# Patient Record
Sex: Female | Born: 1980
Health system: Southern US, Community
[De-identification: ages and names within clinical notes are randomized; demographics above are authoritative.]

## PROBLEM LIST (undated history)

## (undated) DIAGNOSIS — Z789 Other specified health status: Secondary | ICD-10-CM

---

## 2000-05-23 ENCOUNTER — Other Ambulatory Visit: Admission: RE | Admit: 2000-05-23 | Discharge: 2000-05-23 | Payer: Self-pay | Admitting: Family Medicine

## 2004-10-05 ENCOUNTER — Other Ambulatory Visit: Admission: RE | Admit: 2004-10-05 | Discharge: 2004-10-05 | Payer: Self-pay | Admitting: Obstetrics and Gynecology

## 2008-04-07 ENCOUNTER — Other Ambulatory Visit: Admission: RE | Admit: 2008-04-07 | Discharge: 2008-04-07 | Payer: Self-pay | Admitting: Emergency Medicine

## 2010-09-02 HISTORY — PX: DILATION AND CURETTAGE OF UTERUS: SHX78

## 2011-03-27 LAB — OB RESULTS CONSOLE ABO/RH: RH Type: POSITIVE

## 2011-03-27 LAB — OB RESULTS CONSOLE ANTIBODY SCREEN: Antibody Screen: NEGATIVE

## 2011-09-24 LAB — OB RESULTS CONSOLE GBS
GBS: NEGATIVE
GBS: POSITIVE
GBS: POSITIVE

## 2011-10-15 ENCOUNTER — Encounter (HOSPITAL_COMMUNITY): Payer: Self-pay | Admitting: *Deleted

## 2011-10-15 ENCOUNTER — Inpatient Hospital Stay (HOSPITAL_COMMUNITY)
Admission: AD | Admit: 2011-10-15 | Discharge: 2011-10-17 | DRG: 775 | Disposition: A | Discharge status: Home / Self Care | Payer: Managed Care, Other (non HMO) | Source: Ambulatory Visit | Attending: Obstetrics and Gynecology | Admitting: Obstetrics and Gynecology

## 2011-10-15 ENCOUNTER — Encounter (HOSPITAL_COMMUNITY): Payer: Self-pay | Admitting: Anesthesiology

## 2011-10-15 ENCOUNTER — Inpatient Hospital Stay (HOSPITAL_COMMUNITY): Payer: Managed Care, Other (non HMO) | Admitting: Anesthesiology

## 2011-10-15 DIAGNOSIS — O4290 Premature rupture of membranes, unspecified as to length of time between rupture and onset of labor, unspecified weeks of gestation: Principal | ICD-10-CM | POA: Diagnosis present

## 2011-10-15 DIAGNOSIS — O99892 Other specified diseases and conditions complicating childbirth: Secondary | ICD-10-CM | POA: Diagnosis present

## 2011-10-15 DIAGNOSIS — Z22330 Carrier of Group B streptococcus: Secondary | ICD-10-CM

## 2011-10-15 HISTORY — DX: Other specified health status: Z78.9

## 2011-10-15 LAB — CBC
Platelets: 227 10*3/uL (ref 150–400)
RBC: 4.17 MIL/uL (ref 3.87–5.11)
RDW: 13.3 % (ref 11.5–15.5)
WBC: 10 10*3/uL (ref 4.0–10.5)

## 2011-10-15 MED ORDER — COMPLETENATE 29-1 MG PO CHEW
1.0000 | CHEWABLE_TABLET | Freq: Every day | ORAL | Status: DC
  Administered 2011-10-15: 1 via ORAL
  Filled 2011-10-15 (×2): qty 1

## 2011-10-15 MED ORDER — FENTANYL 2.5 MCG/ML BUPIVACAINE 1/10 % EPIDURAL INFUSION (WH - ANES)
14.0000 mL/h | INTRAMUSCULAR | Status: DC
  Administered 2011-10-15: 14 mL/h via EPIDURAL
  Filled 2011-10-15: qty 60

## 2011-10-15 MED ORDER — IBUPROFEN 600 MG PO TABS
600.0000 mg | ORAL_TABLET | Freq: Four times a day (QID) | ORAL | Status: DC
  Administered 2011-10-16 – 2011-10-17 (×5): 600 mg via ORAL
  Filled 2011-10-15 (×6): qty 1

## 2011-10-15 MED ORDER — PENICILLIN G POTASSIUM 5000000 UNITS IJ SOLR
5.0000 10*6.[IU] | Freq: Once | INTRAVENOUS | Status: AC
  Administered 2011-10-15: 5 10*6.[IU] via INTRAVENOUS
  Filled 2011-10-15: qty 5

## 2011-10-15 MED ORDER — LACTATED RINGERS IV SOLN
INTRAVENOUS | Status: DC
  Administered 2011-10-15 (×2): via INTRAVENOUS

## 2011-10-15 MED ORDER — ZOLPIDEM TARTRATE 5 MG PO TABS: 5.0000 mg | ORAL_TABLET | Freq: Every evening | ORAL | Status: DC | PRN

## 2011-10-15 MED ORDER — LIDOCAINE HCL (PF) 1 % IJ SOLN
30.0000 mL | INTRAMUSCULAR | Status: DC | PRN
  Administered 2011-10-15: 30 mL via SUBCUTANEOUS
  Filled 2011-10-15: qty 30

## 2011-10-15 MED ORDER — OXYTOCIN 20 UNITS IN LACTATED RINGERS INFUSION - SIMPLE
1.0000 m[IU]/min | INTRAVENOUS | Status: DC
  Administered 2011-10-15: 1 m[IU]/min via INTRAVENOUS
  Filled 2011-10-15: qty 1000

## 2011-10-15 MED ORDER — OXYTOCIN 20 UNITS IN LACTATED RINGERS INFUSION - SIMPLE: 125.0000 mL/h | INTRAVENOUS | Status: AC

## 2011-10-15 MED ORDER — EPHEDRINE 5 MG/ML INJ
10.0000 mg | INTRAVENOUS | Status: DC | PRN
  Filled 2011-10-15: qty 4

## 2011-10-15 MED ORDER — LIDOCAINE HCL (PF) 1 % IJ SOLN
INTRAMUSCULAR | Status: DC | PRN
  Administered 2011-10-15 (×2): 5 mL

## 2011-10-15 MED ORDER — LACTATED RINGERS IV SOLN: 500.0000 mL | INTRAVENOUS | Status: DC | PRN

## 2011-10-15 MED ORDER — PHENYLEPHRINE 40 MCG/ML (10ML) SYRINGE FOR IV PUSH (FOR BLOOD PRESSURE SUPPORT)
80.0000 ug | PREFILLED_SYRINGE | INTRAVENOUS | Status: DC | PRN
  Filled 2011-10-15: qty 5

## 2011-10-15 MED ORDER — FLEET ENEMA 7-19 GM/118ML RE ENEM: 1.0000 | ENEMA | RECTAL | Status: DC | PRN

## 2011-10-15 MED ORDER — TETANUS-DIPHTH-ACELL PERTUSSIS 5-2.5-18.5 LF-MCG/0.5 IM SUSP: 0.5000 mL | Freq: Once | INTRAMUSCULAR | Status: DC

## 2011-10-15 MED ORDER — IBUPROFEN 600 MG PO TABS: 600.0000 mg | ORAL_TABLET | Freq: Four times a day (QID) | ORAL | Status: DC | PRN

## 2011-10-15 MED ORDER — OXYTOCIN BOLUS FROM INFUSION
500.0000 mL | Freq: Once | INTRAVENOUS | Status: DC
  Filled 2011-10-15: qty 500

## 2011-10-15 MED ORDER — OXYCODONE-ACETAMINOPHEN 5-325 MG PO TABS: 1.0000 | ORAL_TABLET | ORAL | Status: DC | PRN

## 2011-10-15 MED ORDER — SIMETHICONE 80 MG PO CHEW: 80.0000 mg | CHEWABLE_TABLET | ORAL | Status: DC | PRN

## 2011-10-15 MED ORDER — DIPHENHYDRAMINE HCL 50 MG/ML IJ SOLN: 12.5000 mg | INTRAMUSCULAR | Status: DC | PRN

## 2011-10-15 MED ORDER — TERBUTALINE SULFATE 1 MG/ML IJ SOLN: 0.2500 mg | Freq: Once | INTRAMUSCULAR | Status: DC | PRN

## 2011-10-15 MED ORDER — OXYTOCIN 20 UNITS IN LACTATED RINGERS INFUSION - SIMPLE: 125.0000 mL/h | Freq: Once | INTRAVENOUS | Status: DC

## 2011-10-15 MED ORDER — PRENATAL MULTIVITAMIN CH: 1.0000 | ORAL_TABLET | Freq: Every day | ORAL | Status: DC

## 2011-10-15 MED ORDER — LANOLIN HYDROUS EX OINT: TOPICAL_OINTMENT | CUTANEOUS | Status: DC | PRN

## 2011-10-15 MED ORDER — DIPHENHYDRAMINE HCL 25 MG PO CAPS: 25.0000 mg | ORAL_CAPSULE | Freq: Four times a day (QID) | ORAL | Status: DC | PRN

## 2011-10-15 MED ORDER — LACTATED RINGERS IV SOLN: 500.0000 mL | Freq: Once | INTRAVENOUS | Status: DC

## 2011-10-15 MED ORDER — BENZOCAINE-MENTHOL 20-0.5 % EX AERO
1.0000 "application " | INHALATION_SPRAY | CUTANEOUS | Status: DC | PRN
  Administered 2011-10-15: 1 via TOPICAL
  Filled 2011-10-15: qty 56

## 2011-10-15 MED ORDER — PHENYLEPHRINE 40 MCG/ML (10ML) SYRINGE FOR IV PUSH (FOR BLOOD PRESSURE SUPPORT): 80.0000 ug | PREFILLED_SYRINGE | INTRAVENOUS | Status: DC | PRN

## 2011-10-15 MED ORDER — ACETAMINOPHEN 325 MG PO TABS: 650.0000 mg | ORAL_TABLET | ORAL | Status: DC | PRN

## 2011-10-15 MED ORDER — ONDANSETRON HCL 4 MG/2ML IJ SOLN: 4.0000 mg | Freq: Four times a day (QID) | INTRAMUSCULAR | Status: DC | PRN

## 2011-10-15 MED ORDER — PENICILLIN G POTASSIUM 5000000 UNITS IJ SOLR
2.5000 10*6.[IU] | INTRAVENOUS | Status: DC
  Filled 2011-10-15 (×3): qty 2.5

## 2011-10-15 MED ORDER — WITCH HAZEL-GLYCERIN EX PADS
1.0000 "application " | MEDICATED_PAD | CUTANEOUS | Status: DC | PRN
  Administered 2011-10-15: 1 via TOPICAL

## 2011-10-15 MED ORDER — EPHEDRINE 5 MG/ML INJ: 10.0000 mg | INTRAVENOUS | Status: DC | PRN

## 2011-10-15 MED ORDER — MEASLES, MUMPS & RUBELLA VAC ~~LOC~~ INJ
0.5000 mL | INJECTION | Freq: Once | SUBCUTANEOUS | Status: DC
  Filled 2011-10-15: qty 0.5

## 2011-10-15 MED ORDER — SENNOSIDES-DOCUSATE SODIUM 8.6-50 MG PO TABS
2.0000 | ORAL_TABLET | Freq: Every day | ORAL | Status: DC
  Administered 2011-10-15 – 2011-10-16 (×2): 2 via ORAL

## 2011-10-15 MED ORDER — ONDANSETRON HCL 4 MG/2ML IJ SOLN: 4.0000 mg | INTRAMUSCULAR | Status: DC | PRN

## 2011-10-15 MED ORDER — ONDANSETRON HCL 4 MG PO TABS: 4.0000 mg | ORAL_TABLET | ORAL | Status: DC | PRN

## 2011-10-15 MED ORDER — CITRIC ACID-SODIUM CITRATE 334-500 MG/5ML PO SOLN: 30.0000 mL | ORAL | Status: DC | PRN

## 2011-10-15 MED ORDER — DIBUCAINE 1 % RE OINT
1.0000 "application " | TOPICAL_OINTMENT | RECTAL | Status: DC | PRN
  Administered 2011-10-15: 1 via RECTAL
  Filled 2011-10-15: qty 28

## 2011-10-15 NOTE — Anesthesia Preprocedure Evaluation (Signed)

## 2011-10-15 NOTE — Anesthesia Procedure Notes (Signed)
Epidural Patient location during procedure: OB Start time: 10/15/2011 4:03 PM  Staffing Anesthesiologist: Brayton Caves R Performed by: anesthesiologist   Preanesthetic Checklist Completed: patient identified, site marked, surgical consent, pre-op evaluation, timeout performed, IV checked, risks and benefits discussed and monitors and equipment checked  Epidural Patient position: sitting Prep: site prepped and draped and DuraPrep Patient monitoring: continuous pulse ox and blood pressure Approach: midline Injection technique: LOR air and LOR saline  Needle:  Needle type: Tuohy  Needle gauge: 17 G Needle length: 9 cm Needle insertion depth: 5 cm cm Catheter type: closed end flexible Catheter size: 19 Gauge Catheter at skin depth: 10 cm Test dose: negative  Assessment Events: blood not aspirated, injection not painful, no injection resistance, negative IV test and no paresthesia  Additional Notes Patient identified.  Risk benefits discussed including failed block, incomplete pain control, headache, nerve damage, paralysis, blood pressure changes, nausea, vomiting, reactions to medication both toxic or allergic, and postpartum back pain.  Patient expressed understanding and wished to proceed.  All questions were answered.  Sterile technique used throughout procedure and epidural site dressed with sterile barrier dressing. No paresthesia or other complications noted.The patient did not experience any signs of intravascular injection such as tinnitus or metallic taste in mouth nor signs of intrathecal spread such as rapid motor block. Please see nursing notes for vital signs.

## 2011-10-15 NOTE — H&P (Signed)
Jessica Velazquez, Jessica Velazquez         ACCOUNT NO.:  [id number]  MEDICAL RECORD NO.:  [id number]  LOCATION:  9163                          FACILITY:  [facility]  PHYSICIAN:  Malachi Pro. Ambrose Mantle, M.D. DATE OF BIRTH:  07/11/1980  DATE OF ADMISSION:  10/15/2011 DATE OF DISCHARGE:                             HISTORY & PHYSICAL   HISTORY OF PRESENT ILLNESS:  This is a 31 year old Asian female, para 1- 0-1-1, gravida 3.  Last period on January 18, 2011.  [facility] Oct 25, 2011 by an ultrasound at 6 weeks and 6 days on March 07, 2011.  Last Pap smear in April 2013 was ASCUS.  She has a history of colposcopy and LEEP several years ago.  Blood group and type B positive, negative antibody, rubella immune, RPR nonreactive, hepatitis B surface antigen negative, HIV negative, GC and Chlamydia negative.  First trimester screen normal. Cystic fibrosis negative.  Husband was tested, not the patient.  1-hour Glucola 133.  Group B strep was positive.  The patient had been receiving prenatal care in the [place].  She transferred here and had her 1st visit on Oct 09, 2011.  She went into the bathroom today and had spontaneous rupture of membranes with clear fluid.  She came to the office.  The rupture of membranes was confirmed with a positive fern test and fluid was running out of the vagina.  She is admitted for induction of labor.  No known allergies.  OPERATIONS:  D and C.  MEDICAL HISTORY:  She had abnormal Pap with a LEEP procedure.  FAMILY HISTORY:  Father has type 2 diabetes.  Father, mother, and brother have high blood pressure.  The patient denies alcohol, tobacco, and illicit substance abuse.  She is an Electrical engineer, married, with a Manufacturing engineer.  In January 2011, she delivered a 7 pounds 5 ounces female infant vaginally.  In 2012, had a spontaneous abortion.  PHYSICAL EXAMINATION:  GENERAL:  On admission, well-developed and well- nourished Asian female, in no distress.  She is not  contracting. VITAL SIGNS:  Her blood pressure is 110/74, pulse is 68. HEART:  Normal size and sounds.  No murmurs. LUNGS:  Clear to auscultation. ABDOMEN:  Fundal height is 37 cm.  Fetal heart tones 133.  Cervix is 3 cm, 50% vertex, at a -2.  Fluid is running out of the vagina and the fern is positive.  ADMITTING IMPRESSION:  Intrauterine pregnancy at 38 weeks and 4 days, spontaneous rupture of membranes, positive group B strep, ASCUS Pap smear.  The patient is admitted to have treatment with penicillin and to start on Pitocin.  She will receive an epidural.     Malachi Pro. Ambrose Mantle, M.D.     TFH/MEDQ  D:  10/15/2011  T:  10/15/2011  Job:  [id number]

## 2011-10-15 NOTE — Progress Notes (Signed)
Patient ID: Jessica Velazquez, female   DOB: Nov 08, 1980, 31 y.o.   MRN: [id number] Pitocin at 7 mu/ minute and the contractions are fairly regular The pt has an epidural and is comfortable. Cervix is 5 cm 90% effaced and the vertex is at -1/-2 station.

## 2011-10-15 NOTE — Op Note (Signed)
Delivery note:  The pt progressed to full dilatation and pushed well but requested episiotomy because of slow descent. After a ML episiotomy was done under local block with one push the pt delivered a living female infant, weight pending , with Apgars 9 and 9 at 1 and 5 minutes. The placenta delivered intact and the uterus was normal. The ML episiotomy was repaired with 3-0 vicryl.Rectal confirmed no rectal injury.EBL 400 cc's

## 2011-10-16 LAB — CBC
HCT: 33.7 % — ABNORMAL LOW (ref 36.0–46.0)
Hemoglobin: 11.1 g/dL — ABNORMAL LOW (ref 12.0–15.0)
RBC: 3.62 MIL/uL — ABNORMAL LOW (ref 3.87–5.11)
WBC: 13 10*3/uL — ABNORMAL HIGH (ref 4.0–10.5)

## 2011-10-16 LAB — RPR: RPR Ser Ql: NONREACTIVE

## 2011-10-16 NOTE — Progress Notes (Signed)
Patient ID: Jessica Velazquez, female   DOB: [birth year], 31 y.o.   MRN: [id number] #1 afebrile BP normal no complaints

## 2011-10-16 NOTE — Anesthesia Postprocedure Evaluation (Signed)
Anesthesia Post Note  Patient: Jessica Velazquez  Procedure(s) Performed: * No procedures listed *  Anesthesia type: Epidural  Patient location: Mother/Baby  Post pain: Pain level controlled  Post assessment: Post-op Vital signs reviewed  Last Vitals:  Filed Vitals:   10/16/11 0525  BP: 102/66  Pulse: 74  Temp: 36.7 C  Resp: 18    Post vital signs: Reviewed  Level of consciousness: awake  Complications: No apparent anesthesia complications

## 2011-10-16 NOTE — Anesthesia Postprocedure Evaluation (Signed)
  Anesthesia Post-op Note  Patient: Jessica Velazquez  Procedure(s) Performed: * No procedures listed *  Patient Location: Mother/Baby  Anesthesia Type: Epidural  Level of Consciousness: awake, alert  and oriented  Airway and Oxygen Therapy: Patient Spontanous Breathing  Post-op Pain: none  Post-op Assessment: Post-op Vital signs reviewed, Patient's Cardiovascular Status Stable, No headache, No backache, No residual numbness and No residual motor weakness  Post-op Vital Signs: Reviewed and stable  Complications: No apparent anesthesia complications

## 2011-10-16 NOTE — Addendum Note (Signed)
Addendum  created 10/16/11 1610 by Shanon Payor, CRNA   Modules edited:Charges VN, Notes Section

## 2011-10-17 MED ORDER — OXYCODONE-ACETAMINOPHEN 5-325 MG PO TABS: 1.0000 | ORAL_TABLET | Freq: Four times a day (QID) | ORAL | Status: AC | PRN

## 2011-10-17 MED ORDER — IBUPROFEN 600 MG PO TABS: 600.0000 mg | ORAL_TABLET | Freq: Four times a day (QID) | ORAL | Status: AC | PRN

## 2011-10-17 NOTE — Progress Notes (Signed)
Patient ID: Jessica Velazquez, female   DOB: 1980/12/19, 31 y.o.   MRN: [id number] 32 AFEBRILE NO COMPLAINTS FOR D/C.

## 2011-10-17 NOTE — Discharge Instructions (Signed)
booklet °

## 2011-10-18 NOTE — Discharge Summary (Signed)
NAMETYLAR, MERENDINO         ACCOUNT NO.:  [id number]  MEDICAL RECORD NO.:  [id number]  LOCATION:  9110                          FACILITY:  [facility]  PHYSICIAN:  Malachi Pro. Ambrose Mantle, M.D. DATE OF BIRTH:  Sep 19, 1980  DATE OF ADMISSION:  10/15/2011 DATE OF DISCHARGE:  10/17/2011                              DISCHARGE SUMMARY   A 31 year old Asian female, para 1-0-1-1, gravida 3, EDC Oct 25, 2011, was admitted with ruptured membranes.  She had an ASCUS Pap smear, that will need to be evaluated postpartum.  All her prenatal labs were normal except for positive group B strep.  She was treated with penicillin during labor.  After admission to the hospital, she was placed on Pitocin and penicillin.  By 5:18 p.m., the Pitocin was at 7 milliunits a minute.  She was comfortable with an epidural.  The cervix was 5 cm, 90%, vertex, at a -1 to -2 station.  She progressed to full dilatation and pushed well, but requested episiotomy because of slow descent. After midline episiotomy was done under local block with 1 push, the patient delivered a living female infant, Apgars of 9 and 9 at 1 and 5 minutes.  Placenta delivered intact and the uterus was normal. Episiotomy was repaired with 3-0 Vicryl.  Rectal confirmed no rectal injury.  Blood loss about 400 mL.  The baby has done well, was circumcised on the first postpartum day, and the patient is ready for discharge on the second postpartum day.  LABORATORY DATA:  Initial hemoglobin 12.6, hematocrit 38.3, white count 10,000, platelet count 227,000. Followup hemoglobin 11.1.  RPR is nonreactive.  FINAL DIAGNOSES:  Intrauterine pregnancy at 38-1/2 weeks, delivered vertex, premature rupture of the membranes.  OPERATION:  Spontaneous delivery vertex, repair of second-degree midline episiotomy.  FINAL CONDITION:  Improved.  INSTRUCTIONS:  Include, our regular discharge instruction booklet or after visit summary. Return in 6 weeks.  Prescriptions for  Motrin 600 mg, 30 tablets, one every 6 hours as needed for pain and Percocet 10 tablets 5/325 one every 6 hours as needed for pain.     Malachi Pro. Ambrose Mantle, M.D.     TFH/MEDQ  D:  10/17/2011  T:  10/18/2011  Job:  [id number]

## 2011-11-19 ENCOUNTER — Encounter: Payer: Self-pay | Admitting: Internal Medicine

## 2011-11-19 ENCOUNTER — Ambulatory Visit (INDEPENDENT_AMBULATORY_CARE_PROVIDER_SITE_OTHER): Payer: Managed Care, Other (non HMO) | Admitting: Internal Medicine

## 2011-11-19 VITALS — BP 110/70 | HR 72 | Temp 97.9°F | Resp 18 | Ht 62.0 in | Wt 172.0 lb

## 2011-11-19 DIAGNOSIS — Z0000 Encounter for general adult medical examination without abnormal findings: Secondary | ICD-10-CM

## 2011-11-19 NOTE — Progress Notes (Signed)
Subjective:    Patient ID: Jessica Velazquez, female    DOB: Oct 20, 1980, 31 y.o.   MRN: [id number]  HPI  31 year old patient who is seen today to establish with our practice.  She is followed by Dr. Betsy Pries after a recent pregnancy. There is a followup Pap planned- apparently history of an abnormal Pap in [place] about one year ago. No concerns or complaints. Past medical history is unremarkable. 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 Nonsmoker married 2 children Family history father has a history of dyslipidemia hypertension and diabetes. Mother also has a history of hypertension One brother also with treated hypertension Paternal grandmother with breast cancer  Past Medical History  Diagnosis Date  . No pertinent past medical history     History   Social History  . Marital Status: Married    Spouse Name: N/A    Number of Children: N/A  . Years of Education: N/A   Occupational History  . Not on file.   Social History Main Topics  . Smoking status: Never Smoker   . Smokeless tobacco: Never Used  . Alcohol Use: No  . Drug Use: No  . Sexually Active: Yes   Other Topics Concern  . Not on file   Social History Narrative  . No narrative on file    Past Surgical History  Procedure Date  . Dilation and curettage of uterus 09/2010    Family History  Problem Relation Age of Onset  . Arthritis Mother   . Hypertension Mother   . Hyperlipidemia Father   . Hypertension Father   . Diabetes Father   . Hypertension Brother     No Known Allergies  Current Outpatient Prescriptions on File Prior to Visit  Medication Sig Dispense Refill  . prenatal vitamin w/FE, FA (NATACHEW) 29-1 MG CHEW Chew 1 tablet by mouth at bedtime. Gummy        BP 110/70  Pulse 72  Temp 97.9 F (36.6 C) (Oral)  Resp 18  Ht 5\' 2"   (1.575 m)  Wt 172 lb (78.019 kg)  BMI 31.46 kg/m2  SpO2 98%  Breastfeeding? Yes        Review of Systems  Constitutional: Negative.   HENT: Negative for hearing loss, congestion, sore throat, rhinorrhea, dental problem, sinus pressure and tinnitus.   Eyes: Negative for pain, discharge and visual disturbance.  Respiratory: Negative for cough and shortness of breath.   Cardiovascular: Negative for chest pain, palpitations and leg swelling.  Gastrointestinal: Negative for nausea, vomiting, abdominal pain, diarrhea, constipation, blood in stool and abdominal distention.  Genitourinary: Negative for dysuria, urgency, frequency, hematuria, flank pain, vaginal bleeding, vaginal discharge, difficulty urinating, vaginal pain and pelvic pain.  Musculoskeletal: Negative for joint swelling, arthralgias and gait problem.  Skin: Negative for rash.  Neurological: Negative for dizziness, syncope, speech difficulty, weakness, numbness and headaches.  Hematological: Negative for adenopathy.  Psychiatric/Behavioral: Negative for behavioral problems, dysphoric mood and agitation. The patient is not nervous/anxious.        Objective:   Physical Exam  Constitutional: She is oriented to person, place, and time. She appears well-developed and well-nourished.       Weight 172 Blood pressure low normal  HENT:  Head: Normocephalic.  Right Ear: External ear normal.  Left Ear: External ear normal.  Mouth/Throat: Oropharynx is  clear and moist.  Eyes: Conjunctivae and EOM are normal. Pupils are equal, round, and reactive to light.  Neck: Normal range of motion. Neck supple. No thyromegaly present.  Cardiovascular: Normal rate, regular rhythm, normal heart sounds and intact distal pulses.   Pulmonary/Chest: Effort normal and breath sounds normal.  Abdominal: Soft. Bowel sounds are normal. She exhibits no mass. There is no tenderness.  Musculoskeletal: Normal range of motion.  Lymphadenopathy:    She has  no cervical adenopathy.  Neurological: She is alert and oriented to person, place, and time.  Skin: Skin is warm and dry. No rash noted.  Psychiatric: She has a normal mood and affect. Her behavior is normal.          Assessment & Plan:   Preventive health exam Followup gynecology  Return here when necessary

## 2011-11-19 NOTE — Patient Instructions (Signed)
It is important that you exercise regularly, at least 20 minutes 3 to 4 times per week.  If you develop chest pain or shortness of breath seek  medical attention.  Call or return to clinic prn if these symptoms worsen or fail to improve as anticipated.  

## 2012-05-28 ENCOUNTER — Other Ambulatory Visit (INDEPENDENT_AMBULATORY_CARE_PROVIDER_SITE_OTHER): Payer: Managed Care, Other (non HMO)

## 2012-05-28 DIAGNOSIS — Z0000 Encounter for general adult medical examination without abnormal findings: Secondary | ICD-10-CM

## 2012-05-28 LAB — BASIC METABOLIC PANEL
CO2: 25 mEq/L (ref 19–32)
Glucose, Bld: 102 mg/dL — ABNORMAL HIGH (ref 70–99)
Potassium: 3.9 mEq/L (ref 3.5–5.1)
Sodium: 136 mEq/L (ref 135–145)

## 2012-05-28 LAB — POCT URINALYSIS DIPSTICK
Glucose, UA: NEGATIVE
Nitrite, UA: NEGATIVE
Urobilinogen, UA: 0.2

## 2012-05-28 LAB — CBC WITH DIFFERENTIAL/PLATELET
Basophils Absolute: 0 10*3/uL (ref 0.0–0.1)
Basophils Relative: 0.4 % (ref 0.0–3.0)
Eosinophils Absolute: 0.1 10*3/uL (ref 0.0–0.7)
HCT: 41.4 % (ref 36.0–46.0)
Hemoglobin: 13.9 g/dL (ref 12.0–15.0)
Lymphs Abs: 2.9 10*3/uL (ref 0.7–4.0)
MCHC: 33.6 g/dL (ref 30.0–36.0)
Monocytes Relative: 6.2 % (ref 3.0–12.0)
Neutro Abs: 4.1 10*3/uL (ref 1.4–7.7)
RDW: 13.1 % (ref 11.5–14.6)

## 2012-05-28 LAB — HEPATIC FUNCTION PANEL
AST: 14 U/L (ref 0–37)
Albumin: 3.6 g/dL (ref 3.5–5.2)
Total Protein: 7.7 g/dL (ref 6.0–8.3)

## 2012-05-28 LAB — LIPID PANEL
LDL Cholesterol: 124 mg/dL — ABNORMAL HIGH (ref 0–99)
Total CHOL/HDL Ratio: 5
Triglycerides: 151 mg/dL — ABNORMAL HIGH (ref 0.0–149.0)

## 2012-05-28 LAB — TSH: TSH: 0.7 u[IU]/mL (ref 0.35–5.50)

## 2012-06-01 ENCOUNTER — Ambulatory Visit (INDEPENDENT_AMBULATORY_CARE_PROVIDER_SITE_OTHER): Payer: Managed Care, Other (non HMO) | Admitting: Internal Medicine

## 2012-06-01 ENCOUNTER — Encounter: Payer: Self-pay | Admitting: Internal Medicine

## 2012-06-01 VITALS — BP 110/80 | HR 76 | Temp 98.3°F | Resp 18 | Ht 62.25 in | Wt 166.0 lb

## 2012-06-01 DIAGNOSIS — Z0000 Encounter for general adult medical examination without abnormal findings: Secondary | ICD-10-CM

## 2012-06-01 NOTE — Patient Instructions (Signed)
It is important that you exercise regularly, at least 20 minutes 3 to 4 times per week.  If you develop chest pain or shortness of breath seek  medical attention.  You need to lose weight.  Consider a lower calorie diet and regular exercise. 

## 2012-06-01 NOTE — Progress Notes (Signed)
Patient ID: Jessica Velazquez, female   DOB: 07-04-1980, 31 y.o.   MRN: [id number]  Subjective:    Patient ID: Jessica Velazquez, female    DOB: Oct 31, 1980, 31 y.o.   MRN: [id number]  HPI  31 -year-old patient who is seen today for a preventive health examination. She is status with our practice 6 months ago.  She is followed by Dr. Betsy Pries after a recent pregnancy. There is a followup Pap planned- apparently history of an abnormal Pap in [place] about one year ago. No concerns or complaints.  Past medical history is unremarkable. 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 Nonsmoker married 2 children  Family history father has a history of dyslipidemia hypertension and diabetes. Mother also has a history of hypertension One brother also with treated hypertension Paternal grandmother with breast cancer  Past Medical History  Diagnosis Date  . No pertinent past medical history     History   Social History  . Marital Status: Married    Spouse Name: N/A    Number of Children: N/A  . Years of Education: N/A   Occupational History  . Not on file.   Social History Main Topics  . Smoking status: Never Smoker   . Smokeless tobacco: Never Used  . Alcohol Use: No  . Drug Use: No  . Sexually Active: Yes   Other Topics Concern  . Not on file   Social History Narrative  . No narrative on file    Past Surgical History  Procedure Date  . Dilation and curettage of uterus 09/2010    Family History  Problem Relation Age of Onset  . Arthritis Mother   . Hypertension Mother   . Hyperlipidemia Father   . Hypertension Father   . Diabetes Father   . Hypertension Brother     No Known Allergies  Current Outpatient Prescriptions on File Prior to Visit  Medication Sig Dispense Refill  . TRI-PREVIFEM 0.18/0.215/0.25  MG-35 MCG tablet       . prenatal vitamin w/FE, FA (NATACHEW) 29-1 MG CHEW Chew 1 tablet by mouth at bedtime. Gummy        BP 110/80  Pulse 76  Temp 98.3 F (36.8 C) (Oral)  Resp 18  Ht 5' 2.25" (1.581 m)  Wt 166 lb (75.297 kg)  BMI 30.12 kg/m2  LMP 05/27/2012  Breastfeeding? No        Review of Systems  Constitutional: Negative.   HENT: Negative for hearing loss, congestion, sore throat, rhinorrhea, dental problem, sinus pressure and tinnitus.   Eyes: Negative for pain, discharge and visual disturbance.  Respiratory: Negative for cough and shortness of breath.   Cardiovascular: Negative for chest pain, palpitations and leg swelling.  Gastrointestinal: Negative for nausea, vomiting, abdominal pain, diarrhea, constipation, blood in stool and abdominal distention.  Genitourinary: Negative for dysuria, urgency, frequency, hematuria, flank pain, vaginal bleeding, vaginal discharge, difficulty urinating, vaginal pain and pelvic pain.  Musculoskeletal: Negative for joint swelling, arthralgias and gait problem.  Skin: Negative for rash.  Neurological: Negative for dizziness, syncope, speech difficulty, weakness, numbness and headaches.  Hematological: Negative for adenopathy.  Psychiatric/Behavioral: Negative for behavioral problems, dysphoric mood and agitation. The patient is not nervous/anxious.        Objective:   Physical Exam  Constitutional: She is oriented to  person, place, and time. She appears well-developed and well-nourished.       Weight 172 Blood pressure low normal  HENT:  Head: Normocephalic.  Right Ear: External ear normal.  Left Ear: External ear normal.  Mouth/Throat: Oropharynx is clear and moist.  Eyes: Conjunctivae normal and EOM are normal. Pupils are equal, round, and reactive to light.  Neck: Normal range of motion. Neck supple. No thyromegaly present.  Cardiovascular: Normal rate, regular rhythm, normal heart sounds and intact distal pulses.     Pulmonary/Chest: Effort normal and breath sounds normal.  Abdominal: Soft. Bowel sounds are normal. She exhibits no mass. There is no tenderness.  Musculoskeletal: Normal range of motion.  Lymphadenopathy:    She has no cervical adenopathy.  Neurological: She is alert and oriented to person, place, and time.  Skin: Skin is warm and dry. No rash noted.  Psychiatric: She has a normal mood and affect. Her behavior is normal.          Assessment & Plan:   Preventive health exam Followup gynecology  Return here when necessary

## 2012-11-09 ENCOUNTER — Telehealth: Payer: Self-pay | Admitting: Internal Medicine

## 2012-11-09 NOTE — Telephone Encounter (Signed)
Patient Information:  Caller Name: Juliannah  Phone: [phone]  Patient: Jessica Velazquez, Jessica Velazquez  Gender: Female  DOB: 1980/07/25  Age: 32 Years  PCP: Eleonore Chiquito [facility])  Pregnant: No  Office Follow Up:  Does the office need to follow up with this patient?: No  Instructions For The Office: N/A   Symptoms  Reason For Call & Symptoms: Pt is calling to make an appt. Lately if pt eats something raw or greasy she gets bouts of diarrhea and painful stomach cramps. Onset 2 weeks ago. Pt also fatigued. The skin around her eyes has become scaley.  Reviewed Health History In EMR: No  Reviewed Medications In EMR: No  Reviewed Allergies In EMR: Yes  Reviewed Surgeries / Procedures: Yes  Date of Onset of Symptoms: 10/27/2012 OB / GYN:  LMP: Unknown  Guideline(s) Used:  Diarrhea  Disposition Per Guideline:   See Within 3 Days in Office  Reason For Disposition Reached:   Patient wants to be seen  Advice Given:  N/A  Patient Will Follow Care Advice:  YES  Appointment Scheduled:  11/12/2012 09:15:00 Appointment Scheduled Provider:  Eleonore Chiquito (Family Practice)

## 2012-11-12 ENCOUNTER — Ambulatory Visit (INDEPENDENT_AMBULATORY_CARE_PROVIDER_SITE_OTHER): Payer: Managed Care, Other (non HMO) | Admitting: Internal Medicine

## 2012-11-12 ENCOUNTER — Encounter: Payer: Self-pay | Admitting: Internal Medicine

## 2012-11-12 VITALS — BP 110/60 | HR 71 | Temp 98.7°F | Resp 18 | Wt 160.0 lb

## 2012-11-12 DIAGNOSIS — K219 Gastro-esophageal reflux disease without esophagitis: Secondary | ICD-10-CM

## 2012-11-12 DIAGNOSIS — R197 Diarrhea, unspecified: Secondary | ICD-10-CM

## 2012-11-12 NOTE — Progress Notes (Signed)
  Subjective:    Patient ID: Jessica Velazquez, female    DOB: Apr 07, 1981, 32 y.o.   MRN: [id number]  HPI  Wt Readings from Last 3 Encounters:  11/12/12 160 lb (72.576 kg)  06/01/12 166 lb (75.297 kg)  11/19/11 172 lb (78.019 kg)    Review of Systems     Objective:   Physical Exam        Assessment & Plan:

## 2012-11-12 NOTE — Patient Instructions (Signed)
It is important that you exercise regularly, at least 20 minutes 3 to 4 times per week.  If you develop chest pain or shortness of breath seek  medical attention.  Avoids foods high in acid such as tomatoes citrus juices, and spicy foods.  Avoid eating within two hours of lying down or before exercising.  Do not overheat.  Try smaller more frequent meals.  If symptoms persist, elevate the head of her bed 12 inches while sleeping.  Lactose-Free Diet Lactose is a carbohydrate that is found mainly in milk and milk products, as well as in foods with added milk or whey. Lactose must be digested by the enzyme lactase in order to be used by the body. Lactose intolerance occurs when there is a shortage of lactase. When your body is not able to digest lactose, you may feel sick to your stomach (nausea), bloated, and have cramps, gas, and diarrhea. TYPES OF LACTASE DEFICIENCY  Primary lactase deficiency. This is the most common type. It is characterized by a slow decrease in lactase activity.  Secondary lactase deficiency. This occurs following injury to the small intestinal mucosa as a result of a disease or condition. It can also occur as a result of surgery or after treatment with antibiotic medicines or cancer drugs. Tolerance to lactose varies widely. Each person must determine how much milk can be consumed without developing symptoms. Drinking smaller portions of milk throughout the day may be helpful. Some studies suggest that slowing gastric emptying may help increase tolerance of milk products. This may be done by:  Consuming milk or milk products with a meal rather than alone.  Consuming milk with a higher fat content. There are many dairy products that may be tolerated better than milk by some people, including:  Cheese (especially aged cheese). The lactose content is much lower than in milk.  Cultured dairy products, such as yogurt, buttermilk, cottage cheese, and sweet acidophilus milk  (kefir). These products are usually well tolerated by lactase-deficient people. This is because the healthy bacteria help digest lactose.  Lactose-hydrolyzed milk. This product contains 40% to 90% less lactose than milk and may also be well tolerated. ADEQUACY These diets may be deficient in calcium, riboflavin, and vitamin D, according to the Recommended Dietary Allowances of the [employer]. Depending on individual tolerances and the use of milk substitutes, milk, or other dairy products, you may be able to meet these recommendations. SPECIAL NOTES  Lactose is a carbohydrate. The main food source for lactose is dairy products. Reading food labels is important. Many products contain lactose even when they are not made from milk. Look for the following words: whey, milk solids, dry milk solids, nonfat dry milk powder. Typical sources of lactose other than dairy products include breads, candies, cold cuts, prepared and processed foods, and commercial sauces and gravies.  All foods must be prepared without milk, cream, or other dairy foods.  A vitamin or mineral supplement may be necessary. Consult your caregiver or Registered Dietitian.  Lactose is also found in many prescription and over-the-counter medicines.  Soy milk and lactose-free supplements may be used as an alternative to milk. CHOOSING FOODS Breads and Starches  Allowed: Breads and rolls made without milk. [place], [place], or [place] bread. Soda crackers, graham crackers. Any crackers prepared without lactose. Cooked or dry cereals prepared without lactose (read labels). Any potatoes, pasta, or rice prepared without milk or lactose. Popcorn.  Avoid: Breads and rolls that contain milk. Prepared mixes such as muffins,  biscuits, waffles, pancakes. Sweet rolls, donuts, [place] toast (if made with milk or lactose). Zwieback crackers, corn curls, or any crackers that contain lactose. Cooked or dry cereals prepared with lactose  (read labels). Instant potatoes, frozen [place] fries, scalloped or au gratin potatoes. Vegetables  Allowed: Fresh, frozen, and canned vegetables.  Avoid: Creamed or breaded vegetables. Vegetables in a cheese sauce or with lactose-containing margarines. Fruit  Allowed: All fresh, canned, or frozen fruits that are not processed with lactose.  Avoid: Any canned or frozen fruits processed with lactose. Meat and Meat Substitutes  Allowed: Plain beef, chicken, fish, [place], lamb, veal, pork, or ham. Kosher prepared meat products. Strained or junior meats that do not contain milk. Eggs, soy meat substitutes, nuts.  Avoid: Scrambled eggs, omelets, and souffles that contain milk. Creamed or breaded meat, fish, or fowl. Sausage products such as wieners, liver sausage, or cold cuts that contain milk solids. Cheese, cottage cheese, or cheese spreads. Milk  Allowed: None.  Avoid: Milk (whole, 2%, skim, or chocolate). Evaporated, powdered, or condensed milk. Malted milk. Soups and Combination Foods  Allowed: Bouillon, broth, vegetable soups, clear soups, consomms. Homemade soups made with allowed ingredients. Combination or prepared foods that do not contain milk or milk products (read labels).  Avoid: Cream soups, chowders, commercially prepared soups containing lactose. Macaroni and cheese, pizza. Combination or prepared foods that contain milk or milk products. Desserts and Sweets  Allowed: Water and fruit ices, gelatin, angel food cake. Homemade cookies, pies, or cakes made from allowed ingredients. Pudding (if made with water or a milk substitute). Lactose-free tofu desserts. Sugar, honey, corn syrup, jam, jelly, marmalade, molasses (beet sugar). Pure sugar candy, marshmallows.  Avoid: Ice cream, ice milk, sherbet, custard, pudding, frozen yogurt. Commercial cake and cookie mixes. Desserts that contain chocolate. Pie crust made with milk-containing margarine. Reduced calorie desserts made with  a sugar substitute that contains lactose. Toffee, peppermint, butterscotch, chocolate, caramels. Fats and Oils  Allowed: Butter (as tolerated, contains very small amounts of lactose). Margarines and dressings that do not contain milk. Vegetable oils, shortening, mayonnaise, nondairy cream and whipped toppings without lactose or milk solids added. Tomasa Blase.  Avoid: Margarines and salad dressings containing milk. Cream, cream cheese, peanut butter with added milk solids, sour cream, chip dips made with sour cream. Beverages  Allowed: Carbonated drinks, tea, coffee and freeze-dried coffee, some instant coffees (check labels). Fruit drinks, fruit and vegetable juice, rice or soy milk.  Avoid: Hot chocolate. Some cocoas, some instant coffees, instant iced teas, powdered fruit drinks (read labels). Condiments  Allowed: Soy sauce, carob powder, olives, gravy made with water, baker's cocoa, pickles, pure seasonings and spices, wine, pure monosodium glutamate, catsup, mustard.  Avoid: Some chewing gums, chocolate, some cocoas. Certain antibiotics and vitamin or mineral preparations. Spice blends if they contain milk products. MSG extender. Artificial sweeteners that contain lactose. Some nondairy creamers (read labels). SAMPLE MENU Breakfast  Orange juice.  Banana.  Bran cereal.  Nondairy creamer.  Vienna bread, toasted.  Butter or milk-free margarine.  Coffee or tea. Lunch  Chicken breast.  Rice.  Green beans.  Butter or milk-free margarine.  Fresh melon.  Coffee or tea. Dinner  [place].  Baked potato.  Butter or milk-free margarine.  Broccoli.  Lettuce salad with vinegar and oil dressing.  [employer].  Coffee or tea. Document Released: 11/09/2001 Document Revised: 08/12/2011 Document Reviewed: 08/17/2010 [facility] Patient Information 2014 [place], [place].

## 2012-11-12 NOTE — Progress Notes (Signed)
Subjective:    Patient ID: Jessica Velazquez, female    DOB: 17-Aug-1980, 32 y.o.   MRN: [id number]  HPI  32 year old female who is seen today for followup. She is seen for a health maintenance exam 6 months ago that included comprehensive lab. For the past 3 months she has felt a bit unwell she has had some increasing fatigue increasing sleep requirements. She has had some GI distress with occasional indigestion and diarrhea that occurs 1-2 times per week. She does have a history of lactose intolerance in the past this seemed to aggravate the eczema. Diarrhea does not consistently occur with lactose rich foods. She describes some occasional abdominal cramps. She works a full time job and has 2 young children. She states she often requires an afternoon nap.  Past Medical History  Diagnosis Date  . No pertinent past medical history     History   Social History  . Marital Status: Married    Spouse Name: N/A    Number of Children: N/A  . Years of Education: N/A   Occupational History  . Not on file.   Social History Main Topics  . Smoking status: Never Smoker   . Smokeless tobacco: Never Used  . Alcohol Use: No  . Drug Use: No  . Sexually Active: Yes   Other Topics Concern  . Not on file   Social History Narrative  . No narrative on file    Past Surgical History  Procedure Laterality Date  . Dilation and curettage of uterus  09/2010    Family History  Problem Relation Age of Onset  . Arthritis Mother   . Hypertension Mother   . Hyperlipidemia Father   . Hypertension Father   . Diabetes Father   . Hypertension Brother     No Known Allergies  Current Outpatient Prescriptions on File Prior to Visit  Medication Sig Dispense Refill  . TRI-PREVIFEM 0.18/0.215/0.25 MG-35 MCG tablet        No current facility-administered medications on file prior to visit.    BP 110/60  Pulse 71  Temp(Src) 98.7 F (37.1 C) (Oral)  Resp 18  Wt 160 lb (72.576 kg)  BMI 29.04  kg/m2  SpO2 98%  LMP 10/23/2012  Breastfeeding? No       Review of Systems  Constitutional: Positive for fatigue.  HENT: Negative for hearing loss, congestion, sore throat, rhinorrhea, dental problem, sinus pressure and tinnitus.   Eyes: Negative for pain, discharge and visual disturbance.  Respiratory: Negative for cough and shortness of breath.   Cardiovascular: Negative for chest pain, palpitations and leg swelling.  Gastrointestinal: Positive for abdominal pain and diarrhea. Negative for nausea, vomiting, constipation, blood in stool and abdominal distention.  Genitourinary: Negative for dysuria, urgency, frequency, hematuria, flank pain, vaginal bleeding, vaginal discharge, difficulty urinating, vaginal pain and pelvic pain.  Musculoskeletal: Negative for joint swelling, arthralgias and gait problem.  Skin: Negative for rash.  Neurological: Negative for dizziness, syncope, speech difficulty, weakness, numbness and headaches.  Hematological: Negative for adenopathy.  Psychiatric/Behavioral: Negative for behavioral problems, dysphoric mood and agitation. The patient is not nervous/anxious.        Objective:   Physical Exam  Constitutional: She is oriented to person, place, and time. She appears well-developed and well-nourished.  HENT:  Head: Normocephalic.  Right Ear: External ear normal.  Left Ear: External ear normal.  Mouth/Throat: Oropharynx is clear and moist.  Eyes: Conjunctivae and EOM are normal. Pupils are equal, round, and reactive to light.  Neck: Normal range of motion. Neck supple. No thyromegaly present.  Cardiovascular: Normal rate, regular rhythm, normal heart sounds and intact distal pulses.   Pulmonary/Chest: Effort normal and breath sounds normal.  Abdominal: Soft. Bowel sounds are normal. She exhibits no mass. There is no tenderness.  Musculoskeletal: Normal range of motion.  Lymphadenopathy:    She has no cervical adenopathy.  Neurological: She is  alert and oriented to person, place, and time.  Skin: Skin is warm and dry. No rash noted.  Psychiatric: She has a normal mood and affect. Her behavior is normal.          Assessment & Plan:   Fatigue Episodic  Diarrhea History mild lactose intolerance Mild GERD  Will place on a probiotic for couple weeks and observe. We'll try a anti-GERD diet as well as a lactose-free diet.  Suspect the patient is a bit overextended with a full time job and 2 young children.  Stress management discussed

## 2013-01-18 ENCOUNTER — Ambulatory Visit (INDEPENDENT_AMBULATORY_CARE_PROVIDER_SITE_OTHER): Payer: Managed Care, Other (non HMO) | Admitting: Family Medicine

## 2013-01-18 ENCOUNTER — Encounter: Payer: Self-pay | Admitting: Family Medicine

## 2013-01-18 VITALS — BP 118/70 | HR 86 | Temp 98.5°F | Wt 155.0 lb

## 2013-01-18 DIAGNOSIS — J069 Acute upper respiratory infection, unspecified: Secondary | ICD-10-CM

## 2013-01-18 DIAGNOSIS — J029 Acute pharyngitis, unspecified: Secondary | ICD-10-CM

## 2013-01-18 NOTE — Patient Instructions (Signed)
INSTRUCTIONS FOR UPPER RESPIRATORY INFECTION:  -plenty of rest and fluids  -nasal saline wash 2-3 times daily (use prepackaged nasal saline or bottled/distilled water if making your own)   -can use sinex or afrin nasal spray for drainage and nasal congestion - but do NOT use longer then 3-4 days  -can use tylenol or ibuprofen as directed for aches and sorethroat  -if you are taking a cough medication - use only as directed, may also try a teaspoon of honey to coat the throat and throat lozenges  -for sore throat, salt water gargles can help  -follow up if you have fevers, facial pain, tooth pain, difficulty breathing or are worsening or not getting better in 5-7 days  -we will contact you regarding the strep culture test

## 2013-01-18 NOTE — Progress Notes (Addendum)
Chief Complaint  Patient presents with  . URI    Headache, temp (101.5), sore throat since Sunday. Took tylenol 2:45 pm     HPI:  Jessica Velazquez is a 32 yo F pt of Dr. Amador Cunas here for acute visit for  -started yesterday -daughter in preschool and has been sick a lot, husband sick, son sick too -everyone has been running fevers, stomach bugs, URIs -symptoms: sore throat, fever yesterday 101, nasal congestion, drainage in throat, sinus pain, ears full -denies: strep exposure, SOB, wheezing, NVD -no hx sinusitis  ROS: See pertinent positives and negatives per HPI.  Past Medical History  Diagnosis Date  . No pertinent past medical history     Family History  Problem Relation Age of Onset  . Arthritis Mother   . Hypertension Mother   . Hyperlipidemia Father   . Hypertension Father   . Diabetes Father   . Hypertension Brother     History   Social History  . Marital Status: Married    Spouse Name: N/A    Number of Children: N/A  . Years of Education: N/A   Social History Main Topics  . Smoking status: Never Smoker   . Smokeless tobacco: Never Used  . Alcohol Use: No  . Drug Use: No  . Sexual Activity: Yes   Other Topics Concern  . None   Social History Narrative  . None    Current outpatient prescriptions:TRI-PREVIFEM 0.18/0.215/0.25 MG-35 MCG tablet, , Disp: , Rfl:   EXAM:  Filed Vitals:   01/18/13 1556  BP: 118/70  Pulse: 86  Temp: 98.5 F (36.9 C)    Body mass index is 28.13 kg/(m^2).  GENERAL: vitals reviewed and listed above, alert, oriented, appears well hydrated and in no acute distress  HEENT: atraumatic, conjunttiva clear, no obvious abnormalities on inspection of external nose and ears, normal appearance of ear canals and TMs, clear nasal congestion, mild post oropharyngeal erythema with PND, 2+ tonsillar edema without exudate, no sinus TTP  NECK: no obvious masses on inspection  LUNGS: clear to auscultation bilaterally, no  wheezes, rales or rhonchi, good air movement  CV: HRRR, no peripheral edema  MS: moves all extremities without noticeable abnormality  PSYCH: pleasant and cooperative, no obvious depression or anxiety  ASSESSMENT AND PLAN:  Discussed the following assessment and plan:  Sore throat  Upper respiratory infection  -more likely viral per hx and exam - advised supportive care, transmission discussed, return precautions, work note -she is worried about strep due to sore throat so will obtain strep culture, rapid strep neg -Patient advised to return or notify a doctor immediately if symptoms worsen or persist or new concerns arise.  Patient Instructions  INSTRUCTIONS FOR UPPER RESPIRATORY INFECTION:  -plenty of rest and fluids  -nasal saline wash 2-3 times daily (use prepackaged nasal saline or bottled/distilled water if making your own)   -can use sinex or afrin nasal spray for drainage and nasal congestion - but do NOT use longer then 3-4 days  -can use tylenol or ibuprofen as directed for aches and sorethroat  -if you are taking a cough medication - use only as directed, may also try a teaspoon of honey to coat the throat and throat lozenges  -for sore throat, salt water gargles can help  -follow up if you have fevers, facial pain, tooth pain, difficulty breathing or are worsening or not getting better in 5-7 days  -we will contact you regarding the strep culture test  Colin Benton R.

## 2013-01-18 NOTE — Addendum Note (Signed)
Addended by: Rita Ohara R on: 01/18/2013 04:24 PM   Modules accepted: Orders

## 2013-01-20 LAB — CULTURE, GROUP A STREP: Organism ID, Bacteria: NORMAL

## 2013-01-22 NOTE — Progress Notes (Signed)
Quick Note:  Left a message for pt; culture negative. ______

## 2013-01-25 ENCOUNTER — Ambulatory Visit (INDEPENDENT_AMBULATORY_CARE_PROVIDER_SITE_OTHER): Payer: Managed Care, Other (non HMO) | Admitting: Psychology

## 2013-01-25 DIAGNOSIS — F4322 Adjustment disorder with anxiety: Secondary | ICD-10-CM

## 2013-02-08 ENCOUNTER — Ambulatory Visit: Payer: Managed Care, Other (non HMO) | Admitting: Psychology

## 2013-02-09 ENCOUNTER — Ambulatory Visit (INDEPENDENT_AMBULATORY_CARE_PROVIDER_SITE_OTHER): Payer: Managed Care, Other (non HMO) | Admitting: Psychology

## 2013-02-09 DIAGNOSIS — F4322 Adjustment disorder with anxiety: Secondary | ICD-10-CM

## 2013-02-23 ENCOUNTER — Ambulatory Visit (INDEPENDENT_AMBULATORY_CARE_PROVIDER_SITE_OTHER): Payer: Managed Care, Other (non HMO) | Admitting: Psychology

## 2013-02-23 DIAGNOSIS — F4322 Adjustment disorder with anxiety: Secondary | ICD-10-CM

## 2013-03-02 ENCOUNTER — Ambulatory Visit (INDEPENDENT_AMBULATORY_CARE_PROVIDER_SITE_OTHER): Payer: Managed Care, Other (non HMO) | Admitting: Psychology

## 2013-03-02 DIAGNOSIS — F4322 Adjustment disorder with anxiety: Secondary | ICD-10-CM

## 2013-03-19 ENCOUNTER — Ambulatory Visit (INDEPENDENT_AMBULATORY_CARE_PROVIDER_SITE_OTHER): Payer: Managed Care, Other (non HMO) | Admitting: Psychology

## 2013-03-19 DIAGNOSIS — F4322 Adjustment disorder with anxiety: Secondary | ICD-10-CM

## 2013-03-24 ENCOUNTER — Ambulatory Visit (INDEPENDENT_AMBULATORY_CARE_PROVIDER_SITE_OTHER): Payer: Managed Care, Other (non HMO) | Admitting: Psychology

## 2013-03-24 DIAGNOSIS — F4322 Adjustment disorder with anxiety: Secondary | ICD-10-CM

## 2013-03-30 ENCOUNTER — Ambulatory Visit (INDEPENDENT_AMBULATORY_CARE_PROVIDER_SITE_OTHER): Payer: Managed Care, Other (non HMO) | Admitting: Psychology

## 2013-03-30 DIAGNOSIS — F4322 Adjustment disorder with anxiety: Secondary | ICD-10-CM

## 2013-04-07 ENCOUNTER — Ambulatory Visit: Payer: Managed Care, Other (non HMO) | Admitting: Psychology

## 2013-04-07 ENCOUNTER — Ambulatory Visit (INDEPENDENT_AMBULATORY_CARE_PROVIDER_SITE_OTHER): Payer: Managed Care, Other (non HMO) | Admitting: Psychology

## 2013-04-07 DIAGNOSIS — F4322 Adjustment disorder with anxiety: Secondary | ICD-10-CM

## 2013-04-23 ENCOUNTER — Ambulatory Visit: Payer: Self-pay | Admitting: Psychology

## 2013-05-05 ENCOUNTER — Ambulatory Visit (INDEPENDENT_AMBULATORY_CARE_PROVIDER_SITE_OTHER): Payer: Managed Care, Other (non HMO) | Admitting: Psychology

## 2013-05-05 DIAGNOSIS — F4322 Adjustment disorder with anxiety: Secondary | ICD-10-CM

## 2013-05-10 ENCOUNTER — Ambulatory Visit (INDEPENDENT_AMBULATORY_CARE_PROVIDER_SITE_OTHER): Payer: Managed Care, Other (non HMO) | Admitting: Psychology

## 2013-05-10 DIAGNOSIS — F4322 Adjustment disorder with anxiety: Secondary | ICD-10-CM

## 2013-05-12 ENCOUNTER — Ambulatory Visit: Payer: Managed Care, Other (non HMO) | Admitting: Psychology

## 2013-05-17 ENCOUNTER — Ambulatory Visit (INDEPENDENT_AMBULATORY_CARE_PROVIDER_SITE_OTHER): Payer: Managed Care, Other (non HMO) | Admitting: Psychology

## 2013-05-17 ENCOUNTER — Ambulatory Visit: Payer: Managed Care, Other (non HMO) | Admitting: Psychology

## 2013-05-17 DIAGNOSIS — F4322 Adjustment disorder with anxiety: Secondary | ICD-10-CM

## 2013-05-19 ENCOUNTER — Ambulatory Visit: Payer: Managed Care, Other (non HMO) | Admitting: Psychology

## 2013-05-24 ENCOUNTER — Ambulatory Visit (INDEPENDENT_AMBULATORY_CARE_PROVIDER_SITE_OTHER): Payer: Managed Care, Other (non HMO) | Admitting: Psychology

## 2013-05-24 ENCOUNTER — Ambulatory Visit: Payer: Managed Care, Other (non HMO) | Admitting: Psychology

## 2013-05-24 DIAGNOSIS — F4322 Adjustment disorder with anxiety: Secondary | ICD-10-CM

## 2013-05-26 ENCOUNTER — Ambulatory Visit: Payer: Managed Care, Other (non HMO) | Admitting: Psychology

## 2013-05-28 ENCOUNTER — Other Ambulatory Visit (INDEPENDENT_AMBULATORY_CARE_PROVIDER_SITE_OTHER): Payer: Managed Care, Other (non HMO)

## 2013-05-28 DIAGNOSIS — Z0000 Encounter for general adult medical examination without abnormal findings: Secondary | ICD-10-CM

## 2013-05-28 LAB — CBC WITH DIFFERENTIAL/PLATELET
Basophils Absolute: 0 10*3/uL (ref 0.0–0.1)
Eosinophils Relative: 1.1 % (ref 0.0–5.0)
Hemoglobin: 13.7 g/dL (ref 12.0–15.0)
Lymphocytes Relative: 29.3 % (ref 12.0–46.0)
Monocytes Relative: 5.2 % (ref 3.0–12.0)
Neutro Abs: 5 10*3/uL (ref 1.4–7.7)
RDW: 13.1 % (ref 11.5–14.6)
WBC: 7.8 10*3/uL (ref 4.5–10.5)

## 2013-05-28 LAB — POCT URINALYSIS DIPSTICK
Nitrite, UA: NEGATIVE
Urobilinogen, UA: 0.2
pH, UA: 6

## 2013-05-28 LAB — LIPID PANEL
HDL: 39 mg/dL — ABNORMAL LOW (ref >39.00)
LDL Cholesterol: 95 mg/dL (ref 0–99)
Total CHOL/HDL Ratio: 4
Triglycerides: 145 mg/dL (ref 0.0–149.0)

## 2013-05-28 LAB — BASIC METABOLIC PANEL
CO2: 27 mEq/L (ref 19–32)
Chloride: 106 mEq/L (ref 96–112)
Creatinine, Ser: 0.6 mg/dL (ref 0.4–1.2)
Sodium: 137 mEq/L (ref 135–145)

## 2013-05-28 LAB — HEPATIC FUNCTION PANEL
ALT: 16 U/L (ref 0–35)
Total Protein: 6.9 g/dL (ref 6.0–8.3)

## 2013-06-01 ENCOUNTER — Ambulatory Visit (INDEPENDENT_AMBULATORY_CARE_PROVIDER_SITE_OTHER): Payer: Managed Care, Other (non HMO) | Admitting: Internal Medicine

## 2013-06-01 ENCOUNTER — Encounter: Payer: Self-pay | Admitting: Internal Medicine

## 2013-06-01 VITALS — BP 118/80 | HR 82 | Temp 98.3°F | Resp 20 | Ht 62.25 in | Wt 154.0 lb

## 2013-06-01 DIAGNOSIS — Z0000 Encounter for general adult medical examination without abnormal findings: Secondary | ICD-10-CM

## 2013-06-01 NOTE — Progress Notes (Signed)
Patient ID: KURSTEN KRUK, female   DOB: 08-Mar-1981, 32 y.o.   MRN: [id number]  Subjective:    Patient ID: Nyra Capes, female    DOB: 05-20-1981, 32 y.o.   MRN: [id number]  HPI 66  -year-old patient who is seen today for a preventive health examination.  She is followed by Dr. Ambrose Mantle after a recent pregnancy. There is a followup Pap planned- apparently history of an abnormal Pap in [place] in the past. No concerns or complaints.  Past medical history is unremarkable. 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 Nonsmoker married 2 children  Family history father has a history of dyslipidemia hypertension and diabetes. Father status post cholangitis and recent cholecystectomy.  Mother also has a history of hypertension One brother also with treated hypertension Paternal grandmother with breast cancer  Past Medical History  Diagnosis Date  . No pertinent past medical history     History   Social History  . Marital Status: Married    Spouse Name: N/A    Number of Children: N/A  . Years of Education: N/A   Occupational History  . Not on file.   Social History Main Topics  . Smoking status: Never Smoker   . Smokeless tobacco: Never Used  . Alcohol Use: No  . Drug Use: No  . Sexual Activity: Yes   Other Topics Concern  . Not on file   Social History Narrative  . No narrative on file    Past Surgical History  Procedure Laterality Date  . Dilation and curettage of uterus  09/2010    Family History  Problem Relation Age of Onset  . Arthritis Mother   . Hypertension Mother   . Hyperlipidemia Father   . Hypertension Father   . Diabetes Father   . Hypertension Brother     No Known Allergies  Current Outpatient Prescriptions on File Prior to Visit  Medication Sig Dispense Refill  .  TRI-PREVIFEM 0.18/0.215/0.25 MG-35 MCG tablet        No current facility-administered medications on file prior to visit.    BP 118/80  Pulse 82  Temp(Src) 98.3 F (36.8 C) (Oral)  Resp 20  Ht 5' 2.25" (1.581 m)  Wt 154 lb (69.854 kg)  BMI 27.95 kg/m2  SpO2 98%  LMP 05/16/2013  Breastfeeding? No        Review of Systems  Constitutional: Negative.   HENT: Negative for congestion, dental problem, hearing loss, rhinorrhea, sinus pressure, sore throat and tinnitus.   Eyes: Negative for pain, discharge and visual disturbance.  Respiratory: Negative for cough and shortness of breath.   Cardiovascular: Negative for chest pain, palpitations and leg swelling.  Gastrointestinal: Negative for nausea, vomiting, abdominal pain, diarrhea, constipation, blood in stool and abdominal distention.  Genitourinary: Negative for dysuria, urgency, frequency, hematuria, flank pain, vaginal bleeding, vaginal discharge, difficulty urinating, vaginal pain and pelvic pain.  Musculoskeletal: Negative for arthralgias, gait problem and joint swelling.  Skin: Negative for rash.  Neurological: Negative for dizziness, syncope, speech difficulty, weakness, numbness and headaches.  Hematological: Negative for adenopathy.  Psychiatric/Behavioral: Negative for behavioral problems, dysphoric mood and agitation. The patient is not nervous/anxious.        Objective:   Physical Exam  Constitutional: She is oriented to person, place, and time. She appears well-developed and  well-nourished.  Weight 172 Blood pressure low normal  HENT:  Head: Normocephalic.  Right Ear: External ear normal.  Left Ear: External ear normal.  Mouth/Throat: Oropharynx is clear and moist.  Eyes: Conjunctivae and EOM are normal. Pupils are equal, round, and reactive to light.  Neck: Normal range of motion. Neck supple. No thyromegaly present.  Cardiovascular: Normal rate, regular rhythm, normal heart sounds and intact distal pulses.    Pulmonary/Chest: Effort normal and breath sounds normal.  Abdominal: Soft. Bowel sounds are normal. She exhibits no mass. There is no tenderness.  Musculoskeletal: Normal range of motion.  Lymphadenopathy:    She has no cervical adenopathy.  Neurological: She is alert and oriented to person, place, and time.  Skin: Skin is warm and dry. No rash noted.  Psychiatric: She has a normal mood and affect. Her behavior is normal.          Assessment & Plan:   Preventive health exam Followup gynecology  Return here when necessary

## 2013-06-01 NOTE — Progress Notes (Signed)
Pre-visit discussion using our clinic review tool. No additional management support is needed unless otherwise documented below in the visit note.  

## 2013-06-01 NOTE — Patient Instructions (Addendum)
It is important that you exercise regularly, at least 20 minutes 3 to 4 times per week.  If you develop chest pain or shortness of breath seek  medical attention.  Health Maintenance, Female A healthy lifestyle and preventative care can promote health and wellness.  Maintain regular health, dental, and eye exams.  Eat a healthy diet. Foods like vegetables, fruits, whole grains, low-fat dairy products, and lean protein foods contain the nutrients you need without too many calories. Decrease your intake of foods high in solid fats, added sugars, and salt. Get information about a proper diet from your caregiver, if necessary.  Regular physical exercise is one of the most important things you can do for your health. Most adults should get at least 150 minutes of moderate-intensity exercise (any activity that increases your heart rate and causes you to sweat) each week. In addition, most adults need muscle-strengthening exercises on 2 or more days a week.   Maintain a healthy weight. The body mass index (BMI) is a screening tool to identify possible weight problems. It provides an estimate of body fat based on height and weight. Your caregiver can help determine your BMI, and can help you achieve or maintain a healthy weight. For adults 20 years and older:  A BMI below 18.5 is considered underweight.  A BMI of 18.5 to 24.9 is normal.  A BMI of 25 to 29.9 is considered overweight.  A BMI of 30 and above is considered obese.  Maintain normal blood lipids and cholesterol by exercising and minimizing your intake of saturated fat. Eat a balanced diet with plenty of fruits and vegetables. Blood tests for lipids and cholesterol should begin at age 20 and be repeated every 5 years. If your lipid or cholesterol levels are high, you are over 50, or you are a high risk for heart disease, you may need your cholesterol levels checked more frequently.Ongoing high lipid and cholesterol levels should be treated  with medicines if diet and exercise are not effective.  If you smoke, find out from your caregiver how to quit. If you do not use tobacco, do not start.  Lung cancer screening is recommended for adults aged 55 80 years who are at high risk for developing lung cancer because of a history of smoking. Yearly low-dose computed tomography (CT) is recommended for people who have at least a 30-pack-year history of smoking and are a current smoker or have quit within the past 15 years. A pack year of smoking is smoking an average of 1 pack of cigarettes a day for 1 year (for example: 1 pack a day for 30 years or 2 packs a day for 15 years). Yearly screening should continue until the smoker has stopped smoking for at least 15 years. Yearly screening should also be stopped for people who develop a health problem that would prevent them from having lung cancer treatment.  If you are pregnant, do not drink alcohol. If you are breastfeeding, be very cautious about drinking alcohol. If you are not pregnant and choose to drink alcohol, do not exceed 1 drink per day. One drink is considered to be 12 ounces (355 mL) of beer, 5 ounces (148 mL) of wine, or 1.5 ounces (44 mL) of liquor.  Avoid use of street drugs. Do not share needles with anyone. Ask for help if you need support or instructions about stopping the use of drugs.  High blood pressure causes heart disease and increases the risk of stroke. Blood pressure should   be checked at least every 1 to 2 years. Ongoing high blood pressure should be treated with medicines, if weight loss and exercise are not effective.  If you are 55 to 34 years old, ask your caregiver if you should take aspirin to prevent strokes.  Diabetes screening involves taking a blood sample to check your fasting blood sugar level. This should be done once every 3 years, after age 45, if you are within normal weight and without risk factors for diabetes. Testing should be considered at a younger  age or be carried out more frequently if you are overweight and have at least 1 risk factor for diabetes.  Breast cancer screening is essential preventative care for women. You should practice "breast self-awareness." This means understanding the normal appearance and feel of your breasts and may include breast self-examination. Any changes detected, no matter how small, should be reported to a caregiver. Women in their 20s and 30s should have a clinical breast exam (CBE) by a caregiver as part of a regular health exam every 1 to 3 years. After age 40, women should have a CBE every year. Starting at age 40, women should consider having a mammogram (breast X-ray) every year. Women who have a family history of breast cancer should talk to their caregiver about genetic screening. Women at a high risk of breast cancer should talk to their caregiver about having an MRI and a mammogram every year.  Breast cancer gene (BRCA)-related cancer risk assessment is recommended for women who have family members with BRCA-related cancers. BRCA-related cancers include breast, ovarian, tubal, and peritoneal cancers. Having family members with these cancers may be associated with an increased risk for harmful changes (mutations) in the breast cancer genes BRCA1 and BRCA2. Results of the assessment will determine the need for genetic counseling and BRCA1 and BRCA2 testing.  The Pap test is a screening test for cervical cancer. Women should have a Pap test starting at age 21. Between ages 21 and 29, Pap tests should be repeated every 2 years. Beginning at age 30, you should have a Pap test every 3 years as long as the past 3 Pap tests have been normal. If you had a hysterectomy for a problem that was not cancer or a condition that could lead to cancer, then you no longer need Pap tests. If you are between ages 65 and 70, and you have had normal Pap tests going back 10 years, you no longer need Pap tests. If you have had past  treatment for cervical cancer or a condition that could lead to cancer, you need Pap tests and screening for cancer for at least 20 years after your treatment. If Pap tests have been discontinued, risk factors (such as a new sexual partner) need to be reassessed to determine if screening should be resumed. Some women have medical problems that increase the chance of getting cervical cancer. In these cases, your caregiver may recommend more frequent screening and Pap tests.  The human papillomavirus (HPV) test is an additional test that may be used for cervical cancer screening. The HPV test looks for the virus that can cause the cell changes on the cervix. The cells collected during the Pap test can be tested for HPV. The HPV test could be used to screen women aged 30 years and older, and should be used in women of any age who have unclear Pap test results. After the age of 30, women should have HPV testing at the same frequency   Pap test.  Colorectal cancer can be detected and often prevented. Most routine colorectal cancer screening begins at the age of 45 and continues through age 48. However, your caregiver may recommend screening at an earlier age if you have risk factors for colon cancer. On a yearly basis, your caregiver may provide home test kits to check for hidden blood in the stool. Use of a small camera at the end of a tube, to directly examine the colon (sigmoidoscopy or colonoscopy), can detect the earliest forms of colorectal cancer. Talk to your caregiver about this at age 62, when routine screening begins. Direct examination of the colon should be repeated every 5 to 10 years through age 78, unless early forms of pre-cancerous polyps or small growths are found.  Hepatitis C blood testing is recommended for all people born from [birth year] through 1967 and any individual with known risks for hepatitis C.  Practice safe sex. Use condoms and avoid high-risk sexual practices to reduce the spread of sexually  transmitted infections (STIs). Sexually active women aged 51 and younger should be checked for Chlamydia, which is a common sexually transmitted infection. Older women with new or multiple partners should also be tested for Chlamydia. Testing for other STIs is recommended if you are sexually active and at increased risk.  Osteoporosis is a disease in which the bones lose minerals and strength with aging. This can result in serious bone fractures. The risk of osteoporosis can be identified using a bone density scan. Women ages 62 and over and women at risk for fractures or osteoporosis should discuss screening with their caregivers. Ask your caregiver whether you should be taking a calcium supplement or vitamin D to reduce the rate of osteoporosis.  Menopause can be associated with physical symptoms and risks. Hormone replacement therapy is available to decrease symptoms and risks. You should talk to your caregiver about whether hormone replacement therapy is right for you.  Use sunscreen. Apply sunscreen liberally and repeatedly throughout the day. You should seek shade when your shadow is shorter than you. Protect yourself by wearing long sleeves, pants, a wide-brimmed hat, and sunglasses year round, whenever you are outdoors.  Notify your caregiver of new moles or changes in moles, especially if there is a change in shape or color. Also notify your caregiver if a mole is larger than the size of a pencil eraser.  Stay current with your immunizations. Document Released: 12/03/2010 Document Revised: 09/14/2012 Document Reviewed: 12/03/2010 [facility] Patient Information 2014 [place], [place]. Health Maintenance, Female A healthy lifestyle and preventative care can promote health and wellness.  Maintain regular health, dental, and eye exams.  Eat a healthy diet. Foods like vegetables, fruits, whole grains, low-fat dairy products, and lean protein foods contain the nutrients you need without too many  calories. Decrease your intake of foods high in solid fats, added sugars, and salt. Get information about a proper diet from your caregiver, if necessary.  Regular physical exercise is one of the most important things you can do for your health. Most adults should get at least 150 minutes of moderate-intensity exercise (any activity that increases your heart rate and causes you to sweat) each week. In addition, most adults need muscle-strengthening exercises on 2 or more days a week.   Maintain a healthy weight. The body mass index (BMI) is a screening tool to identify possible weight problems. It provides an estimate of body fat based on height and weight. Your caregiver can help determine your BMI, and can  help you achieve or maintain a healthy weight. For adults 20 years and older:  A BMI below 18.5 is considered underweight.  A BMI of 18.5 to 24.9 is normal.  A BMI of 25 to 29.9 is considered overweight.  A BMI of 30 and above is considered obese.  Maintain normal blood lipids and cholesterol by exercising and minimizing your intake of saturated fat. Eat a balanced diet with plenty of fruits and vegetables. Blood tests for lipids and cholesterol should begin at age 73 and be repeated every 5 years. If your lipid or cholesterol levels are high, you are over 50, or you are a high risk for heart disease, you may need your cholesterol levels checked more frequently.Ongoing high lipid and cholesterol levels should be treated with medicines if diet and exercise are not effective.  If you smoke, find out from your caregiver how to quit. If you do not use tobacco, do not start.  Lung cancer screening is recommended for adults aged 49 80 years who are at high risk for developing lung cancer because of a history of smoking. Yearly low-dose computed tomography (CT) is recommended for people who have at least a 30-pack-year history of smoking and are a current smoker or have quit within the past 15  years. A pack year of smoking is smoking an average of 1 pack of cigarettes a day for 1 year (for example: 1 pack a day for 30 years or 2 packs a day for 15 years). Yearly screening should continue until the smoker has stopped smoking for at least 15 years. Yearly screening should also be stopped for people who develop a health problem that would prevent them from having lung cancer treatment.  If you are pregnant, do not drink alcohol. If you are breastfeeding, be very cautious about drinking alcohol. If you are not pregnant and choose to drink alcohol, do not exceed 1 drink per day. One drink is considered to be 12 ounces (355 mL) of beer, 5 ounces (148 mL) of wine, or 1.5 ounces (44 mL) of liquor.  Avoid use of street drugs. Do not share needles with anyone. Ask for help if you need support or instructions about stopping the use of drugs.  High blood pressure causes heart disease and increases the risk of stroke. Blood pressure should be checked at least every 1 to 2 years. Ongoing high blood pressure should be treated with medicines, if weight loss and exercise are not effective.  If you are 61 to 34 years old, ask your caregiver if you should take aspirin to prevent strokes.  Diabetes screening involves taking a blood sample to check your fasting blood sugar level. This should be done once every 3 years, after age 86, if you are within normal weight and without risk factors for diabetes. Testing should be considered at a younger age or be carried out more frequently if you are overweight and have at least 1 risk factor for diabetes.  Breast cancer screening is essential preventative care for women. You should practice "breast self-awareness." This means understanding the normal appearance and feel of your breasts and may include breast self-examination. Any changes detected, no matter how small, should be reported to a caregiver. Women in their 2s and 30s should have a clinical breast exam (CBE) by  a caregiver as part of a regular health exam every 1 to 3 years. After age 53, women should have a CBE every year. Starting at age 78, women should consider having  a mammogram (breast X-ray) every year. Women who have a family history of breast cancer should talk to their caregiver about genetic screening. Women at a high risk of breast cancer should talk to their caregiver about having an MRI and a mammogram every year.  Breast cancer gene (BRCA)-related cancer risk assessment is recommended for women who have family members with BRCA-related cancers. BRCA-related cancers include breast, ovarian, tubal, and peritoneal cancers. Having family members with these cancers may be associated with an increased risk for harmful changes (mutations) in the breast cancer genes BRCA1 and BRCA2. Results of the assessment will determine the need for genetic counseling and BRCA1 and BRCA2 testing.  The Pap test is a screening test for cervical cancer. Women should have a Pap test starting at age 35. Between ages 81 and 5, Pap tests should be repeated every 2 years. Beginning at age 21, you should have a Pap test every 3 years as long as the past 3 Pap tests have been normal. If you had a hysterectomy for a problem that was not cancer or a condition that could lead to cancer, then you no longer need Pap tests. If you are between ages 54 and 71, and you have had normal Pap tests going back 10 years, you no longer need Pap tests. If you have had past treatment for cervical cancer or a condition that could lead to cancer, you need Pap tests and screening for cancer for at least 20 years after your treatment. If Pap tests have been discontinued, risk factors (such as a new sexual partner) need to be reassessed to determine if screening should be resumed. Some women have medical problems that increase the chance of getting cervical cancer. In these cases, your caregiver may recommend more frequent screening and Pap tests.  The  human papillomavirus (HPV) test is an additional test that may be used for cervical cancer screening. The HPV test looks for the virus that can cause the cell changes on the cervix. The cells collected during the Pap test can be tested for HPV. The HPV test could be used to screen women aged 46 years and older, and should be used in women of any age who have unclear Pap test results. After the age of 11, women should have HPV testing at the same frequency as a Pap test.  Colorectal cancer can be detected and often prevented. Most routine colorectal cancer screening begins at the age of 52 and continues through age 73. However, your caregiver may recommend screening at an earlier age if you have risk factors for colon cancer. On a yearly basis, your caregiver may provide home test kits to check for hidden blood in the stool. Use of a small camera at the end of a tube, to directly examine the colon (sigmoidoscopy or colonoscopy), can detect the earliest forms of colorectal cancer. Talk to your caregiver about this at age 57, when routine screening begins. Direct examination of the colon should be repeated every 5 to 10 years through age 64, unless early forms of pre-cancerous polyps or small growths are found.  Hepatitis C blood testing is recommended for all people born from [birth year] through 1967 and any individual with known risks for hepatitis C.  Practice safe sex. Use condoms and avoid high-risk sexual practices to reduce the spread of sexually transmitted infections (STIs). Sexually active women aged 43 and younger should be checked for Chlamydia, which is a common sexually transmitted infection. Older women with new or multiple  partners should also be tested for Chlamydia. Testing for other STIs is recommended if you are sexually active and at increased risk.  Osteoporosis is a disease in which the bones lose minerals and strength with aging. This can result in serious bone fractures. The risk of  osteoporosis can be identified using a bone density scan. Women ages 86 and over and women at risk for fractures or osteoporosis should discuss screening with their caregivers. Ask your caregiver whether you should be taking a calcium supplement or vitamin D to reduce the rate of osteoporosis.  Menopause can be associated with physical symptoms and risks. Hormone replacement therapy is available to decrease symptoms and risks. You should talk to your caregiver about whether hormone replacement therapy is right for you.  Use sunscreen. Apply sunscreen liberally and repeatedly throughout the day. You should seek shade when your shadow is shorter than you. Protect yourself by wearing long sleeves, pants, a wide-brimmed hat, and sunglasses year round, whenever you are outdoors.  Notify your caregiver of new moles or changes in moles, especially if there is a change in shape or color. Also notify your caregiver if a mole is larger than the size of a pencil eraser.  Stay current with your immunizations. Document Released: 12/03/2010 Document Revised: 09/14/2012 Document Reviewed: 12/03/2010 [facility] Patient Information 2014 [place], [place]. Health Maintenance, Female A healthy lifestyle and preventative care can promote health and wellness.  Maintain regular health, dental, and eye exams.  Eat a healthy diet. Foods like vegetables, fruits, whole grains, low-fat dairy products, and lean protein foods contain the nutrients you need without too many calories. Decrease your intake of foods high in solid fats, added sugars, and salt. Get information about a proper diet from your caregiver, if necessary.  Regular physical exercise is one of the most important things you can do for your health. Most adults should get at least 150 minutes of moderate-intensity exercise (any activity that increases your heart rate and causes you to sweat) each week. In addition, most adults need muscle-strengthening exercises on  2 or more days a week.   Maintain a healthy weight. The body mass index (BMI) is a screening tool to identify possible weight problems. It provides an estimate of body fat based on height and weight. Your caregiver can help determine your BMI, and can help you achieve or maintain a healthy weight. For adults 20 years and older:  A BMI below 18.5 is considered underweight.  A BMI of 18.5 to 24.9 is normal.  A BMI of 25 to 29.9 is considered overweight.  A BMI of 30 and above is considered obese.  Maintain normal blood lipids and cholesterol by exercising and minimizing your intake of saturated fat. Eat a balanced diet with plenty of fruits and vegetables. Blood tests for lipids and cholesterol should begin at age 35 and be repeated every 5 years. If your lipid or cholesterol levels are high, you are over 50, or you are a high risk for heart disease, you may need your cholesterol levels checked more frequently.Ongoing high lipid and cholesterol levels should be treated with medicines if diet and exercise are not effective.  If you smoke, find out from your caregiver how to quit. If you do not use tobacco, do not start.  Lung cancer screening is recommended for adults aged 58 80 years who are at high risk for developing lung cancer because of a history of smoking. Yearly low-dose computed tomography (CT) is recommended for people who have  at least a 30-pack-year history of smoking and are a current smoker or have quit within the past 15 years. A pack year of smoking is smoking an average of 1 pack of cigarettes a day for 1 year (for example: 1 pack a day for 30 years or 2 packs a day for 15 years). Yearly screening should continue until the smoker has stopped smoking for at least 15 years. Yearly screening should also be stopped for people who develop a health problem that would prevent them from having lung cancer treatment.  If you are pregnant, do not drink alcohol. If you are breastfeeding, be  very cautious about drinking alcohol. If you are not pregnant and choose to drink alcohol, do not exceed 1 drink per day. One drink is considered to be 12 ounces (355 mL) of beer, 5 ounces (148 mL) of wine, or 1.5 ounces (44 mL) of liquor.  Avoid use of street drugs. Do not share needles with anyone. Ask for help if you need support or instructions about stopping the use of drugs.  High blood pressure causes heart disease and increases the risk of stroke. Blood pressure should be checked at least every 1 to 2 years. Ongoing high blood pressure should be treated with medicines, if weight loss and exercise are not effective.  If you are 58 to 34 years old, ask your caregiver if you should take aspirin to prevent strokes.  Diabetes screening involves taking a blood sample to check your fasting blood sugar level. This should be done once every 3 years, after age 85, if you are within normal weight and without risk factors for diabetes. Testing should be considered at a younger age or be carried out more frequently if you are overweight and have at least 1 risk factor for diabetes.  Breast cancer screening is essential preventative care for women. You should practice "breast self-awareness." This means understanding the normal appearance and feel of your breasts and may include breast self-examination. Any changes detected, no matter how small, should be reported to a caregiver. Women in their 34s and 30s should have a clinical breast exam (CBE) by a caregiver as part of a regular health exam every 1 to 3 years. After age 18, women should have a CBE every year. Starting at age 1, women should consider having a mammogram (breast X-ray) every year. Women who have a family history of breast cancer should talk to their caregiver about genetic screening. Women at a high risk of breast cancer should talk to their caregiver about having an MRI and a mammogram every year.  Breast cancer gene (BRCA)-related cancer  risk assessment is recommended for women who have family members with BRCA-related cancers. BRCA-related cancers include breast, ovarian, tubal, and peritoneal cancers. Having family members with these cancers may be associated with an increased risk for harmful changes (mutations) in the breast cancer genes BRCA1 and BRCA2. Results of the assessment will determine the need for genetic counseling and BRCA1 and BRCA2 testing.  The Pap test is a screening test for cervical cancer. Women should have a Pap test starting at age 76. Between ages 13 and 47, Pap tests should be repeated every 2 years. Beginning at age 96, you should have a Pap test every 3 years as long as the past 3 Pap tests have been normal. If you had a hysterectomy for a problem that was not cancer or a condition that could lead to cancer, then you no longer need Pap tests. If  you are between ages 81 and 70, and you have had normal Pap tests going back 10 years, you no longer need Pap tests. If you have had past treatment for cervical cancer or a condition that could lead to cancer, you need Pap tests and screening for cancer for at least 20 years after your treatment. If Pap tests have been discontinued, risk factors (such as a new sexual partner) need to be reassessed to determine if screening should be resumed. Some women have medical problems that increase the chance of getting cervical cancer. In these cases, your caregiver may recommend more frequent screening and Pap tests.  The human papillomavirus (HPV) test is an additional test that may be used for cervical cancer screening. The HPV test looks for the virus that can cause the cell changes on the cervix. The cells collected during the Pap test can be tested for HPV. The HPV test could be used to screen women aged 70 years and older, and should be used in women of any age who have unclear Pap test results. After the age of 69, women should have HPV testing at the same frequency as a Pap  test.  Colorectal cancer can be detected and often prevented. Most routine colorectal cancer screening begins at the age of 47 and continues through age 81. However, your caregiver may recommend screening at an earlier age if you have risk factors for colon cancer. On a yearly basis, your caregiver may provide home test kits to check for hidden blood in the stool. Use of a small camera at the end of a tube, to directly examine the colon (sigmoidoscopy or colonoscopy), can detect the earliest forms of colorectal cancer. Talk to your caregiver about this at age 20, when routine screening begins. Direct examination of the colon should be repeated every 5 to 10 years through age 4, unless early forms of pre-cancerous polyps or small growths are found.  Hepatitis C blood testing is recommended for all people born from [birth year] through 1967 and any individual with known risks for hepatitis C.  Practice safe sex. Use condoms and avoid high-risk sexual practices to reduce the spread of sexually transmitted infections (STIs). Sexually active women aged 35 and younger should be checked for Chlamydia, which is a common sexually transmitted infection. Older women with new or multiple partners should also be tested for Chlamydia. Testing for other STIs is recommended if you are sexually active and at increased risk.  Osteoporosis is a disease in which the bones lose minerals and strength with aging. This can result in serious bone fractures. The risk of osteoporosis can be identified using a bone density scan. Women ages 21 and over and women at risk for fractures or osteoporosis should discuss screening with their caregivers. Ask your caregiver whether you should be taking a calcium supplement or vitamin D to reduce the rate of osteoporosis.  Menopause can be associated with physical symptoms and risks. Hormone replacement therapy is available to decrease symptoms and risks. You should talk to your caregiver about  whether hormone replacement therapy is right for you.  Use sunscreen. Apply sunscreen liberally and repeatedly throughout the day. You should seek shade when your shadow is shorter than you. Protect yourself by wearing long sleeves, pants, a wide-brimmed hat, and sunglasses year round, whenever you are outdoors.  Notify your caregiver of new moles or changes in moles, especially if there is a change in shape or color. Also notify your caregiver if a mole is  larger than the size of a pencil eraser.  Stay current with your immunizations. Document Released: 12/03/2010 Document Revised: 09/14/2012 Document Reviewed: 12/03/2010 [facility] Patient Information 2014 [place], [place]. Health Maintenance, Female A healthy lifestyle and preventative care can promote health and wellness.  Maintain regular health, dental, and eye exams.  Eat a healthy diet. Foods like vegetables, fruits, whole grains, low-fat dairy products, and lean protein foods contain the nutrients you need without too many calories. Decrease your intake of foods high in solid fats, added sugars, and salt. Get information about a proper diet from your caregiver, if necessary.  Regular physical exercise is one of the most important things you can do for your health. Most adults should get at least 150 minutes of moderate-intensity exercise (any activity that increases your heart rate and causes you to sweat) each week. In addition, most adults need muscle-strengthening exercises on 2 or more days a week.   Maintain a healthy weight. The body mass index (BMI) is a screening tool to identify possible weight problems. It provides an estimate of body fat based on height and weight. Your caregiver can help determine your BMI, and can help you achieve or maintain a healthy weight. For adults 20 years and older:  A BMI below 18.5 is considered underweight.  A BMI of 18.5 to 24.9 is normal.  A BMI of 25 to 29.9 is considered overweight.  A BMI  of 30 and above is considered obese.  Maintain normal blood lipids and cholesterol by exercising and minimizing your intake of saturated fat. Eat a balanced diet with plenty of fruits and vegetables. Blood tests for lipids and cholesterol should begin at age 2 and be repeated every 5 years. If your lipid or cholesterol levels are high, you are over 50, or you are a high risk for heart disease, you may need your cholesterol levels checked more frequently.Ongoing high lipid and cholesterol levels should be treated with medicines if diet and exercise are not effective.  If you smoke, find out from your caregiver how to quit. If you do not use tobacco, do not start.  Lung cancer screening is recommended for adults aged 54 80 years who are at high risk for developing lung cancer because of a history of smoking. Yearly low-dose computed tomography (CT) is recommended for people who have at least a 30-pack-year history of smoking and are a current smoker or have quit within the past 15 years. A pack year of smoking is smoking an average of 1 pack of cigarettes a day for 1 year (for example: 1 pack a day for 30 years or 2 packs a day for 15 years). Yearly screening should continue until the smoker has stopped smoking for at least 15 years. Yearly screening should also be stopped for people who develop a health problem that would prevent them from having lung cancer treatment.  If you are pregnant, do not drink alcohol. If you are breastfeeding, be very cautious about drinking alcohol. If you are not pregnant and choose to drink alcohol, do not exceed 1 drink per day. One drink is considered to be 12 ounces (355 mL) of beer, 5 ounces (148 mL) of wine, or 1.5 ounces (44 mL) of liquor.  Avoid use of street drugs. Do not share needles with anyone. Ask for help if you need support or instructions about stopping the use of drugs.  High blood pressure causes heart disease and increases the risk of stroke. Blood  pressure should be checked  at least every 1 to 2 years. Ongoing high blood pressure should be treated with medicines, if weight loss and exercise are not effective.  If you are 83 to 34 years old, ask your caregiver if you should take aspirin to prevent strokes.  Diabetes screening involves taking a blood sample to check your fasting blood sugar level. This should be done once every 3 years, after age 72, if you are within normal weight and without risk factors for diabetes. Testing should be considered at a younger age or be carried out more frequently if you are overweight and have at least 1 risk factor for diabetes.  Breast cancer screening is essential preventative care for women. You should practice "breast self-awareness." This means understanding the normal appearance and feel of your breasts and may include breast self-examination. Any changes detected, no matter how small, should be reported to a caregiver. Women in their 63s and 30s should have a clinical breast exam (CBE) by a caregiver as part of a regular health exam every 1 to 3 years. After age 29, women should have a CBE every year. Starting at age 9, women should consider having a mammogram (breast X-ray) every year. Women who have a family history of breast cancer should talk to their caregiver about genetic screening. Women at a high risk of breast cancer should talk to their caregiver about having an MRI and a mammogram every year.  Breast cancer gene (BRCA)-related cancer risk assessment is recommended for women who have family members with BRCA-related cancers. BRCA-related cancers include breast, ovarian, tubal, and peritoneal cancers. Having family members with these cancers may be associated with an increased risk for harmful changes (mutations) in the breast cancer genes BRCA1 and BRCA2. Results of the assessment will determine the need for genetic counseling and BRCA1 and BRCA2 testing.  The Pap test is a screening test for  cervical cancer. Women should have a Pap test starting at age 28. Between ages 25 and 66, Pap tests should be repeated every 2 years. Beginning at age 62, you should have a Pap test every 3 years as long as the past 3 Pap tests have been normal. If you had a hysterectomy for a problem that was not cancer or a condition that could lead to cancer, then you no longer need Pap tests. If you are between ages 40 and 69, and you have had normal Pap tests going back 10 years, you no longer need Pap tests. If you have had past treatment for cervical cancer or a condition that could lead to cancer, you need Pap tests and screening for cancer for at least 20 years after your treatment. If Pap tests have been discontinued, risk factors (such as a new sexual partner) need to be reassessed to determine if screening should be resumed. Some women have medical problems that increase the chance of getting cervical cancer. In these cases, your caregiver may recommend more frequent screening and Pap tests.  The human papillomavirus (HPV) test is an additional test that may be used for cervical cancer screening. The HPV test looks for the virus that can cause the cell changes on the cervix. The cells collected during the Pap test can be tested for HPV. The HPV test could be used to screen women aged 18 years and older, and should be used in women of any age who have unclear Pap test results. After the age of 76, women should have HPV testing at the same frequency as a Pap  test.  Colorectal cancer can be detected and often prevented. Most routine colorectal cancer screening begins at the age of 44 and continues through age 52. However, your caregiver may recommend screening at an earlier age if you have risk factors for colon cancer. On a yearly basis, your caregiver may provide home test kits to check for hidden blood in the stool. Use of a small camera at the end of a tube, to directly examine the colon (sigmoidoscopy or  colonoscopy), can detect the earliest forms of colorectal cancer. Talk to your caregiver about this at age 31, when routine screening begins. Direct examination of the colon should be repeated every 5 to 10 years through age 34, unless early forms of pre-cancerous polyps or small growths are found.  Hepatitis C blood testing is recommended for all people born from [birth year] through 1967 and any individual with known risks for hepatitis C.  Practice safe sex. Use condoms and avoid high-risk sexual practices to reduce the spread of sexually transmitted infections (STIs). Sexually active women aged 45 and younger should be checked for Chlamydia, which is a common sexually transmitted infection. Older women with new or multiple partners should also be tested for Chlamydia. Testing for other STIs is recommended if you are sexually active and at increased risk.  Osteoporosis is a disease in which the bones lose minerals and strength with aging. This can result in serious bone fractures. The risk of osteoporosis can be identified using a bone density scan. Women ages 12 and over and women at risk for fractures or osteoporosis should discuss screening with their caregivers. Ask your caregiver whether you should be taking a calcium supplement or vitamin D to reduce the rate of osteoporosis.  Menopause can be associated with physical symptoms and risks. Hormone replacement therapy is available to decrease symptoms and risks. You should talk to your caregiver about whether hormone replacement therapy is right for you.  Use sunscreen. Apply sunscreen liberally and repeatedly throughout the day. You should seek shade when your shadow is shorter than you. Protect yourself by wearing long sleeves, pants, a wide-brimmed hat, and sunglasses year round, whenever you are outdoors.  Notify your caregiver of new moles or changes in moles, especially if there is a change in shape or color. Also notify your caregiver if a mole is  larger than the size of a pencil eraser.  Stay current with your immunizations. Document Released: 12/03/2010 Document Revised: 09/14/2012 Document Reviewed: 12/03/2010 [facility] Patient Information 2014 [place], [place]. Health Maintenance, Female A healthy lifestyle and preventative care can promote health and wellness.  Maintain regular health, dental, and eye exams.  Eat a healthy diet. Foods like vegetables, fruits, whole grains, low-fat dairy products, and lean protein foods contain the nutrients you need without too many calories. Decrease your intake of foods high in solid fats, added sugars, and salt. Get information about a proper diet from your caregiver, if necessary.  Regular physical exercise is one of the most important things you can do for your health. Most adults should get at least 150 minutes of moderate-intensity exercise (any activity that increases your heart rate and causes you to sweat) each week. In addition, most adults need muscle-strengthening exercises on 2 or more days a week.   Maintain a healthy weight. The body mass index (BMI) is a screening tool to identify possible weight problems. It provides an estimate of body fat based on height and weight. Your caregiver can help determine your BMI, and can help  you achieve or maintain a healthy weight. For adults 20 years and older:  A BMI below 18.5 is considered underweight.  A BMI of 18.5 to 24.9 is normal.  A BMI of 25 to 29.9 is considered overweight.  A BMI of 30 and above is considered obese.  Maintain normal blood lipids and cholesterol by exercising and minimizing your intake of saturated fat. Eat a balanced diet with plenty of fruits and vegetables. Blood tests for lipids and cholesterol should begin at age 84 and be repeated every 5 years. If your lipid or cholesterol levels are high, you are over 50, or you are a high risk for heart disease, you may need your cholesterol levels checked more  frequently.Ongoing high lipid and cholesterol levels should be treated with medicines if diet and exercise are not effective.  If you smoke, find out from your caregiver how to quit. If you do not use tobacco, do not start.  Lung cancer screening is recommended for adults aged 76 80 years who are at high risk for developing lung cancer because of a history of smoking. Yearly low-dose computed tomography (CT) is recommended for people who have at least a 30-pack-year history of smoking and are a current smoker or have quit within the past 15 years. A pack year of smoking is smoking an average of 1 pack of cigarettes a day for 1 year (for example: 1 pack a day for 30 years or 2 packs a day for 15 years). Yearly screening should continue until the smoker has stopped smoking for at least 15 years. Yearly screening should also be stopped for people who develop a health problem that would prevent them from having lung cancer treatment.  If you are pregnant, do not drink alcohol. If you are breastfeeding, be very cautious about drinking alcohol. If you are not pregnant and choose to drink alcohol, do not exceed 1 drink per day. One drink is considered to be 12 ounces (355 mL) of beer, 5 ounces (148 mL) of wine, or 1.5 ounces (44 mL) of liquor.  Avoid use of street drugs. Do not share needles with anyone. Ask for help if you need support or instructions about stopping the use of drugs.  High blood pressure causes heart disease and increases the risk of stroke. Blood pressure should be checked at least every 1 to 2 years. Ongoing high blood pressure should be treated with medicines, if weight loss and exercise are not effective.  If you are 66 to 34 years old, ask your caregiver if you should take aspirin to prevent strokes.  Diabetes screening involves taking a blood sample to check your fasting blood sugar level. This should be done once every 3 years, after age 38, if you are within normal weight and  without risk factors for diabetes. Testing should be considered at a younger age or be carried out more frequently if you are overweight and have at least 1 risk factor for diabetes.  Breast cancer screening is essential preventative care for women. You should practice "breast self-awareness." This means understanding the normal appearance and feel of your breasts and may include breast self-examination. Any changes detected, no matter how small, should be reported to a caregiver. Women in their 40s and 30s should have a clinical breast exam (CBE) by a caregiver as part of a regular health exam every 1 to 3 years. After age 39, women should have a CBE every year. Starting at age 52, women should consider having a  mammogram (breast X-ray) every year. Women who have a family history of breast cancer should talk to their caregiver about genetic screening. Women at a high risk of breast cancer should talk to their caregiver about having an MRI and a mammogram every year.  Breast cancer gene (BRCA)-related cancer risk assessment is recommended for women who have family members with BRCA-related cancers. BRCA-related cancers include breast, ovarian, tubal, and peritoneal cancers. Having family members with these cancers may be associated with an increased risk for harmful changes (mutations) in the breast cancer genes BRCA1 and BRCA2. Results of the assessment will determine the need for genetic counseling and BRCA1 and BRCA2 testing.  The Pap test is a screening test for cervical cancer. Women should have a Pap test starting at age 43. Between ages 24 and 19, Pap tests should be repeated every 2 years. Beginning at age 48, you should have a Pap test every 3 years as long as the past 3 Pap tests have been normal. If you had a hysterectomy for a problem that was not cancer or a condition that could lead to cancer, then you no longer need Pap tests. If you are between ages 82 and 31, and you have had normal Pap tests  going back 10 years, you no longer need Pap tests. If you have had past treatment for cervical cancer or a condition that could lead to cancer, you need Pap tests and screening for cancer for at least 20 years after your treatment. If Pap tests have been discontinued, risk factors (such as a new sexual partner) need to be reassessed to determine if screening should be resumed. Some women have medical problems that increase the chance of getting cervical cancer. In these cases, your caregiver may recommend more frequent screening and Pap tests.  The human papillomavirus (HPV) test is an additional test that may be used for cervical cancer screening. The HPV test looks for the virus that can cause the cell changes on the cervix. The cells collected during the Pap test can be tested for HPV. The HPV test could be used to screen women aged 24 years and older, and should be used in women of any age who have unclear Pap test results. After the age of 76, women should have HPV testing at the same frequency as a Pap test.  Colorectal cancer can be detected and often prevented. Most routine colorectal cancer screening begins at the age of 41 and continues through age 55. However, your caregiver may recommend screening at an earlier age if you have risk factors for colon cancer. On a yearly basis, your caregiver may provide home test kits to check for hidden blood in the stool. Use of a small camera at the end of a tube, to directly examine the colon (sigmoidoscopy or colonoscopy), can detect the earliest forms of colorectal cancer. Talk to your caregiver about this at age 88, when routine screening begins. Direct examination of the colon should be repeated every 5 to 10 years through age 43, unless early forms of pre-cancerous polyps or small growths are found.  Hepatitis C blood testing is recommended for all people born from [birth year] through 1967 and any individual with known risks for hepatitis C.  Practice safe sex.  Use condoms and avoid high-risk sexual practices to reduce the spread of sexually transmitted infections (STIs). Sexually active women aged 25 and younger should be checked for Chlamydia, which is a common sexually transmitted infection. Older women with new or multiple  partners should also be tested for Chlamydia. Testing for other STIs is recommended if you are sexually active and at increased risk.  Osteoporosis is a disease in which the bones lose minerals and strength with aging. This can result in serious bone fractures. The risk of osteoporosis can be identified using a bone density scan. Women ages 108 and over and women at risk for fractures or osteoporosis should discuss screening with their caregivers. Ask your caregiver whether you should be taking a calcium supplement or vitamin D to reduce the rate of osteoporosis.  Menopause can be associated with physical symptoms and risks. Hormone replacement therapy is available to decrease symptoms and risks. You should talk to your caregiver about whether hormone replacement therapy is right for you.  Use sunscreen. Apply sunscreen liberally and repeatedly throughout the day. You should seek shade when your shadow is shorter than you. Protect yourself by wearing long sleeves, pants, a wide-brimmed hat, and sunglasses year round, whenever you are outdoors.  Notify your caregiver of new moles or changes in moles, especially if there is a change in shape or color. Also notify your caregiver if a mole is larger than the size of a pencil eraser.  Stay current with your immunizations. Document Released: 12/03/2010 Document Revised: 09/14/2012 Document Reviewed: 12/03/2010 [facility] Patient Information 2014 [place], [place]. Health Maintenance, Female A healthy lifestyle and preventative care can promote health and wellness.  Maintain regular health, dental, and eye exams.  Eat a healthy diet. Foods like vegetables, fruits, whole grains, low-fat dairy  products, and lean protein foods contain the nutrients you need without too many calories. Decrease your intake of foods high in solid fats, added sugars, and salt. Get information about a proper diet from your caregiver, if necessary.  Regular physical exercise is one of the most important things you can do for your health. Most adults should get at least 150 minutes of moderate-intensity exercise (any activity that increases your heart rate and causes you to sweat) each week. In addition, most adults need muscle-strengthening exercises on 2 or more days a week.   Maintain a healthy weight. The body mass index (BMI) is a screening tool to identify possible weight problems. It provides an estimate of body fat based on height and weight. Your caregiver can help determine your BMI, and can help you achieve or maintain a healthy weight. For adults 20 years and older:  A BMI below 18.5 is considered underweight.  A BMI of 18.5 to 24.9 is normal.  A BMI of 25 to 29.9 is considered overweight.  A BMI of 30 and above is considered obese.  Maintain normal blood lipids and cholesterol by exercising and minimizing your intake of saturated fat. Eat a balanced diet with plenty of fruits and vegetables. Blood tests for lipids and cholesterol should begin at age 60 and be repeated every 5 years. If your lipid or cholesterol levels are high, you are over 50, or you are a high risk for heart disease, you may need your cholesterol levels checked more frequently.Ongoing high lipid and cholesterol levels should be treated with medicines if diet and exercise are not effective.  If you smoke, find out from your caregiver how to quit. If you do not use tobacco, do not start.  Lung cancer screening is recommended for adults aged 23 80 years who are at high risk for developing lung cancer because of a history of smoking. Yearly low-dose computed tomography (CT) is recommended for people who have at  least a 30-pack-year  history of smoking and are a current smoker or have quit within the past 15 years. A pack year of smoking is smoking an average of 1 pack of cigarettes a day for 1 year (for example: 1 pack a day for 30 years or 2 packs a day for 15 years). Yearly screening should continue until the smoker has stopped smoking for at least 15 years. Yearly screening should also be stopped for people who develop a health problem that would prevent them from having lung cancer treatment.  If you are pregnant, do not drink alcohol. If you are breastfeeding, be very cautious about drinking alcohol. If you are not pregnant and choose to drink alcohol, do not exceed 1 drink per day. One drink is considered to be 12 ounces (355 mL) of beer, 5 ounces (148 mL) of wine, or 1.5 ounces (44 mL) of liquor.  Avoid use of street drugs. Do not share needles with anyone. Ask for help if you need support or instructions about stopping the use of drugs.  High blood pressure causes heart disease and increases the risk of stroke. Blood pressure should be checked at least every 1 to 2 years. Ongoing high blood pressure should be treated with medicines, if weight loss and exercise are not effective.  If you are 76 to 34 years old, ask your caregiver if you should take aspirin to prevent strokes.  Diabetes screening involves taking a blood sample to check your fasting blood sugar level. This should be done once every 3 years, after age 26, if you are within normal weight and without risk factors for diabetes. Testing should be considered at a younger age or be carried out more frequently if you are overweight and have at least 1 risk factor for diabetes.  Breast cancer screening is essential preventative care for women. You should practice "breast self-awareness." This means understanding the normal appearance and feel of your breasts and may include breast self-examination. Any changes detected, no matter how small, should be reported to a  caregiver. Women in their 37s and 30s should have a clinical breast exam (CBE) by a caregiver as part of a regular health exam every 1 to 3 years. After age 34, women should have a CBE every year. Starting at age 35, women should consider having a mammogram (breast X-ray) every year. Women who have a family history of breast cancer should talk to their caregiver about genetic screening. Women at a high risk of breast cancer should talk to their caregiver about having an MRI and a mammogram every year.  Breast cancer gene (BRCA)-related cancer risk assessment is recommended for women who have family members with BRCA-related cancers. BRCA-related cancers include breast, ovarian, tubal, and peritoneal cancers. Having family members with these cancers may be associated with an increased risk for harmful changes (mutations) in the breast cancer genes BRCA1 and BRCA2. Results of the assessment will determine the need for genetic counseling and BRCA1 and BRCA2 testing.  The Pap test is a screening test for cervical cancer. Women should have a Pap test starting at age 47. Between ages 29 and 58, Pap tests should be repeated every 2 years. Beginning at age 4, you should have a Pap test every 3 years as long as the past 3 Pap tests have been normal. If you had a hysterectomy for a problem that was not cancer or a condition that could lead to cancer, then you no longer need Pap tests. If you  are between ages 8 and 42, and you have had normal Pap tests going back 10 years, you no longer need Pap tests. If you have had past treatment for cervical cancer or a condition that could lead to cancer, you need Pap tests and screening for cancer for at least 20 years after your treatment. If Pap tests have been discontinued, risk factors (such as a new sexual partner) need to be reassessed to determine if screening should be resumed. Some women have medical problems that increase the chance of getting cervical cancer. In these  cases, your caregiver may recommend more frequent screening and Pap tests.  The human papillomavirus (HPV) test is an additional test that may be used for cervical cancer screening. The HPV test looks for the virus that can cause the cell changes on the cervix. The cells collected during the Pap test can be tested for HPV. The HPV test could be used to screen women aged 74 years and older, and should be used in women of any age who have unclear Pap test results. After the age of 44, women should have HPV testing at the same frequency as a Pap test.  Colorectal cancer can be detected and often prevented. Most routine colorectal cancer screening begins at the age of 27 and continues through age 40. However, your caregiver may recommend screening at an earlier age if you have risk factors for colon cancer. On a yearly basis, your caregiver may provide home test kits to check for hidden blood in the stool. Use of a small camera at the end of a tube, to directly examine the colon (sigmoidoscopy or colonoscopy), can detect the earliest forms of colorectal cancer. Talk to your caregiver about this at age 59, when routine screening begins. Direct examination of the colon should be repeated every 5 to 10 years through age 30, unless early forms of pre-cancerous polyps or small growths are found.  Hepatitis C blood testing is recommended for all people born from [birth year] through 1967 and any individual with known risks for hepatitis C.  Practice safe sex. Use condoms and avoid high-risk sexual practices to reduce the spread of sexually transmitted infections (STIs). Sexually active women aged 42 and younger should be checked for Chlamydia, which is a common sexually transmitted infection. Older women with new or multiple partners should also be tested for Chlamydia. Testing for other STIs is recommended if you are sexually active and at increased risk.  Osteoporosis is a disease in which the bones lose minerals and  strength with aging. This can result in serious bone fractures. The risk of osteoporosis can be identified using a bone density scan. Women ages 50 and over and women at risk for fractures or osteoporosis should discuss screening with their caregivers. Ask your caregiver whether you should be taking a calcium supplement or vitamin D to reduce the rate of osteoporosis.  Menopause can be associated with physical symptoms and risks. Hormone replacement therapy is available to decrease symptoms and risks. You should talk to your caregiver about whether hormone replacement therapy is right for you.  Use sunscreen. Apply sunscreen liberally and repeatedly throughout the day. You should seek shade when your shadow is shorter than you. Protect yourself by wearing long sleeves, pants, a wide-brimmed hat, and sunglasses year round, whenever you are outdoors.  Notify your caregiver of new moles or changes in moles, especially if there is a change in shape or color. Also notify your caregiver if a mole is larger  than the size of a pencil eraser.  Stay current with your immunizations. Document Released: 12/03/2010 Document Revised: 09/14/2012 Document Reviewed: 12/03/2010 [facility] Patient Information 2014 [place], [place]. Health Maintenance, Female A healthy lifestyle and preventative care can promote health and wellness.  Maintain regular health, dental, and eye exams.  Eat a healthy diet. Foods like vegetables, fruits, whole grains, low-fat dairy products, and lean protein foods contain the nutrients you need without too many calories. Decrease your intake of foods high in solid fats, added sugars, and salt. Get information about a proper diet from your caregiver, if necessary.  Regular physical exercise is one of the most important things you can do for your health. Most adults should get at least 150 minutes of moderate-intensity exercise (any activity that increases your heart rate and causes you to sweat)  each week. In addition, most adults need muscle-strengthening exercises on 2 or more days a week.   Maintain a healthy weight. The body mass index (BMI) is a screening tool to identify possible weight problems. It provides an estimate of body fat based on height and weight. Your caregiver can help determine your BMI, and can help you achieve or maintain a healthy weight. For adults 20 years and older:  A BMI below 18.5 is considered underweight.  A BMI of 18.5 to 24.9 is normal.  A BMI of 25 to 29.9 is considered overweight.  A BMI of 30 and above is considered obese.  Maintain normal blood lipids and cholesterol by exercising and minimizing your intake of saturated fat. Eat a balanced diet with plenty of fruits and vegetables. Blood tests for lipids and cholesterol should begin at age 51 and be repeated every 5 years. If your lipid or cholesterol levels are high, you are over 50, or you are a high risk for heart disease, you may need your cholesterol levels checked more frequently.Ongoing high lipid and cholesterol levels should be treated with medicines if diet and exercise are not effective.  If you smoke, find out from your caregiver how to quit. If you do not use tobacco, do not start.  Lung cancer screening is recommended for adults aged 68 80 years who are at high risk for developing lung cancer because of a history of smoking. Yearly low-dose computed tomography (CT) is recommended for people who have at least a 30-pack-year history of smoking and are a current smoker or have quit within the past 15 years. A pack year of smoking is smoking an average of 1 pack of cigarettes a day for 1 year (for example: 1 pack a day for 30 years or 2 packs a day for 15 years). Yearly screening should continue until the smoker has stopped smoking for at least 15 years. Yearly screening should also be stopped for people who develop a health problem that would prevent them from having lung cancer  treatment.  If you are pregnant, do not drink alcohol. If you are breastfeeding, be very cautious about drinking alcohol. If you are not pregnant and choose to drink alcohol, do not exceed 1 drink per day. One drink is considered to be 12 ounces (355 mL) of beer, 5 ounces (148 mL) of wine, or 1.5 ounces (44 mL) of liquor.  Avoid use of street drugs. Do not share needles with anyone. Ask for help if you need support or instructions about stopping the use of drugs.  High blood pressure causes heart disease and increases the risk of stroke. Blood pressure should be checked at  least every 1 to 2 years. Ongoing high blood pressure should be treated with medicines, if weight loss and exercise are not effective.  If you are 59 to 34 years old, ask your caregiver if you should take aspirin to prevent strokes.  Diabetes screening involves taking a blood sample to check your fasting blood sugar level. This should be done once every 3 years, after age 56, if you are within normal weight and without risk factors for diabetes. Testing should be considered at a younger age or be carried out more frequently if you are overweight and have at least 1 risk factor for diabetes.  Breast cancer screening is essential preventative care for women. You should practice "breast self-awareness." This means understanding the normal appearance and feel of your breasts and may include breast self-examination. Any changes detected, no matter how small, should be reported to a caregiver. Women in their 56s and 30s should have a clinical breast exam (CBE) by a caregiver as part of a regular health exam every 1 to 3 years. After age 50, women should have a CBE every year. Starting at age 52, women should consider having a mammogram (breast X-ray) every year. Women who have a family history of breast cancer should talk to their caregiver about genetic screening. Women at a high risk of breast cancer should talk to their caregiver about  having an MRI and a mammogram every year.  Breast cancer gene (BRCA)-related cancer risk assessment is recommended for women who have family members with BRCA-related cancers. BRCA-related cancers include breast, ovarian, tubal, and peritoneal cancers. Having family members with these cancers may be associated with an increased risk for harmful changes (mutations) in the breast cancer genes BRCA1 and BRCA2. Results of the assessment will determine the need for genetic counseling and BRCA1 and BRCA2 testing.  The Pap test is a screening test for cervical cancer. Women should have a Pap test starting at age 58. Between ages 46 and 60, Pap tests should be repeated every 2 years. Beginning at age 45, you should have a Pap test every 3 years as long as the past 3 Pap tests have been normal. If you had a hysterectomy for a problem that was not cancer or a condition that could lead to cancer, then you no longer need Pap tests. If you are between ages 49 and 11, and you have had normal Pap tests going back 10 years, you no longer need Pap tests. If you have had past treatment for cervical cancer or a condition that could lead to cancer, you need Pap tests and screening for cancer for at least 20 years after your treatment. If Pap tests have been discontinued, risk factors (such as a new sexual partner) need to be reassessed to determine if screening should be resumed. Some women have medical problems that increase the chance of getting cervical cancer. In these cases, your caregiver may recommend more frequent screening and Pap tests.  The human papillomavirus (HPV) test is an additional test that may be used for cervical cancer screening. The HPV test looks for the virus that can cause the cell changes on the cervix. The cells collected during the Pap test can be tested for HPV. The HPV test could be used to screen women aged 78 years and older, and should be used in women of any age who have unclear Pap test  results. After the age of 52, women should have HPV testing at the same frequency as a Pap  test.  Colorectal cancer can be detected and often prevented. Most routine colorectal cancer screening begins at the age of 40 and continues through age 13. However, your caregiver may recommend screening at an earlier age if you have risk factors for colon cancer. On a yearly basis, your caregiver may provide home test kits to check for hidden blood in the stool. Use of a small camera at the end of a tube, to directly examine the colon (sigmoidoscopy or colonoscopy), can detect the earliest forms of colorectal cancer. Talk to your caregiver about this at age 4, when routine screening begins. Direct examination of the colon should be repeated every 5 to 10 years through age 67, unless early forms of pre-cancerous polyps or small growths are found.  Hepatitis C blood testing is recommended for all people born from [birth year] through 1967 and any individual with known risks for hepatitis C.  Practice safe sex. Use condoms and avoid high-risk sexual practices to reduce the spread of sexually transmitted infections (STIs). Sexually active women aged 35 and younger should be checked for Chlamydia, which is a common sexually transmitted infection. Older women with new or multiple partners should also be tested for Chlamydia. Testing for other STIs is recommended if you are sexually active and at increased risk.  Osteoporosis is a disease in which the bones lose minerals and strength with aging. This can result in serious bone fractures. The risk of osteoporosis can be identified using a bone density scan. Women ages 43 and over and women at risk for fractures or osteoporosis should discuss screening with their caregivers. Ask your caregiver whether you should be taking a calcium supplement or vitamin D to reduce the rate of osteoporosis.  Menopause can be associated with physical symptoms and risks. Hormone replacement therapy  is available to decrease symptoms and risks. You should talk to your caregiver about whether hormone replacement therapy is right for you.  Use sunscreen. Apply sunscreen liberally and repeatedly throughout the day. You should seek shade when your shadow is shorter than you. Protect yourself by wearing long sleeves, pants, a wide-brimmed hat, and sunglasses year round, whenever you are outdoors.  Notify your caregiver of new moles or changes in moles, especially if there is a change in shape or color. Also notify your caregiver if a mole is larger than the size of a pencil eraser.  Stay current with your immunizations. Document Released: 12/03/2010 Document Revised: 09/14/2012 Document Reviewed: 12/03/2010 [facility] Patient Information 2014 [place], [place]. Health Maintenance, Female A healthy lifestyle and preventative care can promote health and wellness.  Maintain regular health, dental, and eye exams.  Eat a healthy diet. Foods like vegetables, fruits, whole grains, low-fat dairy products, and lean protein foods contain the nutrients you need without too many calories. Decrease your intake of foods high in solid fats, added sugars, and salt. Get information about a proper diet from your caregiver, if necessary.  Regular physical exercise is one of the most important things you can do for your health. Most adults should get at least 150 minutes of moderate-intensity exercise (any activity that increases your heart rate and causes you to sweat) each week. In addition, most adults need muscle-strengthening exercises on 2 or more days a week.   Maintain a healthy weight. The body mass index (BMI) is a screening tool to identify possible weight problems. It provides an estimate of body fat based on height and weight. Your caregiver can help determine your BMI, and can help you  achieve or maintain a healthy weight. For adults 20 years and older:  A BMI below 18.5 is considered underweight.  A BMI  of 18.5 to 24.9 is normal.  A BMI of 25 to 29.9 is considered overweight.  A BMI of 30 and above is considered obese.  Maintain normal blood lipids and cholesterol by exercising and minimizing your intake of saturated fat. Eat a balanced diet with plenty of fruits and vegetables. Blood tests for lipids and cholesterol should begin at age 3 and be repeated every 5 years. If your lipid or cholesterol levels are high, you are over 50, or you are a high risk for heart disease, you may need your cholesterol levels checked more frequently.Ongoing high lipid and cholesterol levels should be treated with medicines if diet and exercise are not effective.  If you smoke, find out from your caregiver how to quit. If you do not use tobacco, do not start.  Lung cancer screening is recommended for adults aged 68 80 years who are at high risk for developing lung cancer because of a history of smoking. Yearly low-dose computed tomography (CT) is recommended for people who have at least a 30-pack-year history of smoking and are a current smoker or have quit within the past 15 years. A pack year of smoking is smoking an average of 1 pack of cigarettes a day for 1 year (for example: 1 pack a day for 30 years or 2 packs a day for 15 years). Yearly screening should continue until the smoker has stopped smoking for at least 15 years. Yearly screening should also be stopped for people who develop a health problem that would prevent them from having lung cancer treatment.  If you are pregnant, do not drink alcohol. If you are breastfeeding, be very cautious about drinking alcohol. If you are not pregnant and choose to drink alcohol, do not exceed 1 drink per day. One drink is considered to be 12 ounces (355 mL) of beer, 5 ounces (148 mL) of wine, or 1.5 ounces (44 mL) of liquor.  Avoid use of street drugs. Do not share needles with anyone. Ask for help if you need support or instructions about stopping the use of  drugs.  High blood pressure causes heart disease and increases the risk of stroke. Blood pressure should be checked at least every 1 to 2 years. Ongoing high blood pressure should be treated with medicines, if weight loss and exercise are not effective.  If you are 33 to 34 years old, ask your caregiver if you should take aspirin to prevent strokes.  Diabetes screening involves taking a blood sample to check your fasting blood sugar level. This should be done once every 3 years, after age 56, if you are within normal weight and without risk factors for diabetes. Testing should be considered at a younger age or be carried out more frequently if you are overweight and have at least 1 risk factor for diabetes.  Breast cancer screening is essential preventative care for women. You should practice "breast self-awareness." This means understanding the normal appearance and feel of your breasts and may include breast self-examination. Any changes detected, no matter how small, should be reported to a caregiver. Women in their 45s and 30s should have a clinical breast exam (CBE) by a caregiver as part of a regular health exam every 1 to 3 years. After age 55, women should have a CBE every year. Starting at age 65, women should consider having a mammogram (  breast X-ray) every year. Women who have a family history of breast cancer should talk to their caregiver about genetic screening. Women at a high risk of breast cancer should talk to their caregiver about having an MRI and a mammogram every year.  Breast cancer gene (BRCA)-related cancer risk assessment is recommended for women who have family members with BRCA-related cancers. BRCA-related cancers include breast, ovarian, tubal, and peritoneal cancers. Having family members with these cancers may be associated with an increased risk for harmful changes (mutations) in the breast cancer genes BRCA1 and BRCA2. Results of the assessment will determine the need for  genetic counseling and BRCA1 and BRCA2 testing.  The Pap test is a screening test for cervical cancer. Women should have a Pap test starting at age 71. Between ages 67 and 55, Pap tests should be repeated every 2 years. Beginning at age 2, you should have a Pap test every 3 years as long as the past 3 Pap tests have been normal. If you had a hysterectomy for a problem that was not cancer or a condition that could lead to cancer, then you no longer need Pap tests. If you are between ages 52 and 74, and you have had normal Pap tests going back 10 years, you no longer need Pap tests. If you have had past treatment for cervical cancer or a condition that could lead to cancer, you need Pap tests and screening for cancer for at least 20 years after your treatment. If Pap tests have been discontinued, risk factors (such as a new sexual partner) need to be reassessed to determine if screening should be resumed. Some women have medical problems that increase the chance of getting cervical cancer. In these cases, your caregiver may recommend more frequent screening and Pap tests.  The human papillomavirus (HPV) test is an additional test that may be used for cervical cancer screening. The HPV test looks for the virus that can cause the cell changes on the cervix. The cells collected during the Pap test can be tested for HPV. The HPV test could be used to screen women aged 63 years and older, and should be used in women of any age who have unclear Pap test results. After the age of 37, women should have HPV testing at the same frequency as a Pap test.  Colorectal cancer can be detected and often prevented. Most routine colorectal cancer screening begins at the age of 67 and continues through age 91. However, your caregiver may recommend screening at an earlier age if you have risk factors for colon cancer. On a yearly basis, your caregiver may provide home test kits to check for hidden blood in the stool. Use of a small  camera at the end of a tube, to directly examine the colon (sigmoidoscopy or colonoscopy), can detect the earliest forms of colorectal cancer. Talk to your caregiver about this at age 26, when routine screening begins. Direct examination of the colon should be repeated every 5 to 10 years through age 22, unless early forms of pre-cancerous polyps or small growths are found.  Hepatitis C blood testing is recommended for all people born from [birth year] through 1967 and any individual with known risks for hepatitis C.  Practice safe sex. Use condoms and avoid high-risk sexual practices to reduce the spread of sexually transmitted infections (STIs). Sexually active women aged 72 and younger should be checked for Chlamydia, which is a common sexually transmitted infection. Older women with new or multiple partners  should also be tested for Chlamydia. Testing for other STIs is recommended if you are sexually active and at increased risk.  Osteoporosis is a disease in which the bones lose minerals and strength with aging. This can result in serious bone fractures. The risk of osteoporosis can be identified using a bone density scan. Women ages 57 and over and women at risk for fractures or osteoporosis should discuss screening with their caregivers. Ask your caregiver whether you should be taking a calcium supplement or vitamin D to reduce the rate of osteoporosis.  Menopause can be associated with physical symptoms and risks. Hormone replacement therapy is available to decrease symptoms and risks. You should talk to your caregiver about whether hormone replacement therapy is right for you.  Use sunscreen. Apply sunscreen liberally and repeatedly throughout the day. You should seek shade when your shadow is shorter than you. Protect yourself by wearing long sleeves, pants, a wide-brimmed hat, and sunglasses year round, whenever you are outdoors.  Notify your caregiver of new moles or changes in moles, especially if  there is a change in shape or color. Also notify your caregiver if a mole is larger than the size of a pencil eraser.  Stay current with your immunizations. Document Released: 12/03/2010 Document Revised: 09/14/2012 Document Reviewed: 12/03/2010 [facility] Patient Information 2014 [place], [place]. Health Maintenance, Female A healthy lifestyle and preventative care can promote health and wellness.  Maintain regular health, dental, and eye exams.  Eat a healthy diet. Foods like vegetables, fruits, whole grains, low-fat dairy products, and lean protein foods contain the nutrients you need without too many calories. Decrease your intake of foods high in solid fats, added sugars, and salt. Get information about a proper diet from your caregiver, if necessary.  Regular physical exercise is one of the most important things you can do for your health. Most adults should get at least 150 minutes of moderate-intensity exercise (any activity that increases your heart rate and causes you to sweat) each week. In addition, most adults need muscle-strengthening exercises on 2 or more days a week.   Maintain a healthy weight. The body mass index (BMI) is a screening tool to identify possible weight problems. It provides an estimate of body fat based on height and weight. Your caregiver can help determine your BMI, and can help you achieve or maintain a healthy weight. For adults 20 years and older:  A BMI below 18.5 is considered underweight.  A BMI of 18.5 to 24.9 is normal.  A BMI of 25 to 29.9 is considered overweight.  A BMI of 30 and above is considered obese.  Maintain normal blood lipids and cholesterol by exercising and minimizing your intake of saturated fat. Eat a balanced diet with plenty of fruits and vegetables. Blood tests for lipids and cholesterol should begin at age 95 and be repeated every 5 years. If your lipid or cholesterol levels are high, you are over 50, or you are a high risk for  heart disease, you may need your cholesterol levels checked more frequently.Ongoing high lipid and cholesterol levels should be treated with medicines if diet and exercise are not effective.  If you smoke, find out from your caregiver how to quit. If you do not use tobacco, do not start.  Lung cancer screening is recommended for adults aged 45 80 years who are at high risk for developing lung cancer because of a history of smoking. Yearly low-dose computed tomography (CT) is recommended for people who have at  least a 30-pack-year history of smoking and are a current smoker or have quit within the past 15 years. A pack year of smoking is smoking an average of 1 pack of cigarettes a day for 1 year (for example: 1 pack a day for 30 years or 2 packs a day for 15 years). Yearly screening should continue until the smoker has stopped smoking for at least 15 years. Yearly screening should also be stopped for people who develop a health problem that would prevent them from having lung cancer treatment.  If you are pregnant, do not drink alcohol. If you are breastfeeding, be very cautious about drinking alcohol. If you are not pregnant and choose to drink alcohol, do not exceed 1 drink per day. One drink is considered to be 12 ounces (355 mL) of beer, 5 ounces (148 mL) of wine, or 1.5 ounces (44 mL) of liquor.  Avoid use of street drugs. Do not share needles with anyone. Ask for help if you need support or instructions about stopping the use of drugs.  High blood pressure causes heart disease and increases the risk of stroke. Blood pressure should be checked at least every 1 to 2 years. Ongoing high blood pressure should be treated with medicines, if weight loss and exercise are not effective.  If you are 12 to 34 years old, ask your caregiver if you should take aspirin to prevent strokes.  Diabetes screening involves taking a blood sample to check your fasting blood sugar level. This should be done once every  3 years, after age 70, if you are within normal weight and without risk factors for diabetes. Testing should be considered at a younger age or be carried out more frequently if you are overweight and have at least 1 risk factor for diabetes.  Breast cancer screening is essential preventative care for women. You should practice "breast self-awareness." This means understanding the normal appearance and feel of your breasts and may include breast self-examination. Any changes detected, no matter how small, should be reported to a caregiver. Women in their 73s and 30s should have a clinical breast exam (CBE) by a caregiver as part of a regular health exam every 1 to 3 years. After age 94, women should have a CBE every year. Starting at age 51, women should consider having a mammogram (breast X-ray) every year. Women who have a family history of breast cancer should talk to their caregiver about genetic screening. Women at a high risk of breast cancer should talk to their caregiver about having an MRI and a mammogram every year.  Breast cancer gene (BRCA)-related cancer risk assessment is recommended for women who have family members with BRCA-related cancers. BRCA-related cancers include breast, ovarian, tubal, and peritoneal cancers. Having family members with these cancers may be associated with an increased risk for harmful changes (mutations) in the breast cancer genes BRCA1 and BRCA2. Results of the assessment will determine the need for genetic counseling and BRCA1 and BRCA2 testing.  The Pap test is a screening test for cervical cancer. Women should have a Pap test starting at age 61. Between ages 80 and 59, Pap tests should be repeated every 2 years. Beginning at age 33, you should have a Pap test every 3 years as long as the past 3 Pap tests have been normal. If you had a hysterectomy for a problem that was not cancer or a condition that could lead to cancer, then you no longer need Pap tests. If you are  between ages 75 and 64, and you have had normal Pap tests going back 10 years, you no longer need Pap tests. If you have had past treatment for cervical cancer or a condition that could lead to cancer, you need Pap tests and screening for cancer for at least 20 years after your treatment. If Pap tests have been discontinued, risk factors (such as a new sexual partner) need to be reassessed to determine if screening should be resumed. Some women have medical problems that increase the chance of getting cervical cancer. In these cases, your caregiver may recommend more frequent screening and Pap tests.  The human papillomavirus (HPV) test is an additional test that may be used for cervical cancer screening. The HPV test looks for the virus that can cause the cell changes on the cervix. The cells collected during the Pap test can be tested for HPV. The HPV test could be used to screen women aged 44 years and older, and should be used in women of any age who have unclear Pap test results. After the age of 47, women should have HPV testing at the same frequency as a Pap test.  Colorectal cancer can be detected and often prevented. Most routine colorectal cancer screening begins at the age of 50 and continues through age 37. However, your caregiver may recommend screening at an earlier age if you have risk factors for colon cancer. On a yearly basis, your caregiver may provide home test kits to check for hidden blood in the stool. Use of a small camera at the end of a tube, to directly examine the colon (sigmoidoscopy or colonoscopy), can detect the earliest forms of colorectal cancer. Talk to your caregiver about this at age 31, when routine screening begins. Direct examination of the colon should be repeated every 5 to 10 years through age 64, unless early forms of pre-cancerous polyps or small growths are found.  Hepatitis C blood testing is recommended for all people born from [birth year] through 1967 and any  individual with known risks for hepatitis C.  Practice safe sex. Use condoms and avoid high-risk sexual practices to reduce the spread of sexually transmitted infections (STIs). Sexually active women aged 81 and younger should be checked for Chlamydia, which is a common sexually transmitted infection. Older women with new or multiple partners should also be tested for Chlamydia. Testing for other STIs is recommended if you are sexually active and at increased risk.  Osteoporosis is a disease in which the bones lose minerals and strength with aging. This can result in serious bone fractures. The risk of osteoporosis can be identified using a bone density scan. Women ages 46 and over and women at risk for fractures or osteoporosis should discuss screening with their caregivers. Ask your caregiver whether you should be taking a calcium supplement or vitamin D to reduce the rate of osteoporosis.  Menopause can be associated with physical symptoms and risks. Hormone replacement therapy is available to decrease symptoms and risks. You should talk to your caregiver about whether hormone replacement therapy is right for you.  Use sunscreen. Apply sunscreen liberally and repeatedly throughout the day. You should seek shade when your shadow is shorter than you. Protect yourself by wearing long sleeves, pants, a wide-brimmed hat, and sunglasses year round, whenever you are outdoors.  Notify your caregiver of new moles or changes in moles, especially if there is a change in shape or color. Also notify your caregiver if a mole is larger than the  size of a pencil eraser.  Stay current with your immunizations. Document Released: 12/03/2010 Document Revised: 09/14/2012 Document Reviewed: 12/03/2010 [facility] Patient Information 2014 [place], [place]. Health Maintenance, Female A healthy lifestyle and preventative care can promote health and wellness.  Maintain regular health, dental, and eye exams.  Eat a healthy  diet. Foods like vegetables, fruits, whole grains, low-fat dairy products, and lean protein foods contain the nutrients you need without too many calories. Decrease your intake of foods high in solid fats, added sugars, and salt. Get information about a proper diet from your caregiver, if necessary.  Regular physical exercise is one of the most important things you can do for your health. Most adults should get at least 150 minutes of moderate-intensity exercise (any activity that increases your heart rate and causes you to sweat) each week. In addition, most adults need muscle-strengthening exercises on 2 or more days a week.   Maintain a healthy weight. The body mass index (BMI) is a screening tool to identify possible weight problems. It provides an estimate of body fat based on height and weight. Your caregiver can help determine your BMI, and can help you achieve or maintain a healthy weight. For adults 20 years and older:  A BMI below 18.5 is considered underweight.  A BMI of 18.5 to 24.9 is normal.  A BMI of 25 to 29.9 is considered overweight.  A BMI of 30 and above is considered obese.  Maintain normal blood lipids and cholesterol by exercising and minimizing your intake of saturated fat. Eat a balanced diet with plenty of fruits and vegetables. Blood tests for lipids and cholesterol should begin at age 67 and be repeated every 5 years. If your lipid or cholesterol levels are high, you are over 50, or you are a high risk for heart disease, you may need your cholesterol levels checked more frequently.Ongoing high lipid and cholesterol levels should be treated with medicines if diet and exercise are not effective.  If you smoke, find out from your caregiver how to quit. If you do not use tobacco, do not start.  Lung cancer screening is recommended for adults aged 33 80 years who are at high risk for developing lung cancer because of a history of smoking. Yearly low-dose computed tomography  (CT) is recommended for people who have at least a 30-pack-year history of smoking and are a current smoker or have quit within the past 15 years. A pack year of smoking is smoking an average of 1 pack of cigarettes a day for 1 year (for example: 1 pack a day for 30 years or 2 packs a day for 15 years). Yearly screening should continue until the smoker has stopped smoking for at least 15 years. Yearly screening should also be stopped for people who develop a health problem that would prevent them from having lung cancer treatment.  If you are pregnant, do not drink alcohol. If you are breastfeeding, be very cautious about drinking alcohol. If you are not pregnant and choose to drink alcohol, do not exceed 1 drink per day. One drink is considered to be 12 ounces (355 mL) of beer, 5 ounces (148 mL) of wine, or 1.5 ounces (44 mL) of liquor.  Avoid use of street drugs. Do not share needles with anyone. Ask for help if you need support or instructions about stopping the use of drugs.  High blood pressure causes heart disease and increases the risk of stroke. Blood pressure should be checked at least every  1 to 2 years. Ongoing high blood pressure should be treated with medicines, if weight loss and exercise are not effective.  If you are 33 to 34 years old, ask your caregiver if you should take aspirin to prevent strokes.  Diabetes screening involves taking a blood sample to check your fasting blood sugar level. This should be done once every 3 years, after age 56, if you are within normal weight and without risk factors for diabetes. Testing should be considered at a younger age or be carried out more frequently if you are overweight and have at least 1 risk factor for diabetes.  Breast cancer screening is essential preventative care for women. You should practice "breast self-awareness." This means understanding the normal appearance and feel of your breasts and may include breast self-examination. Any  changes detected, no matter how small, should be reported to a caregiver. Women in their 79s and 30s should have a clinical breast exam (CBE) by a caregiver as part of a regular health exam every 1 to 3 years. After age 32, women should have a CBE every year. Starting at age 23, women should consider having a mammogram (breast X-ray) every year. Women who have a family history of breast cancer should talk to their caregiver about genetic screening. Women at a high risk of breast cancer should talk to their caregiver about having an MRI and a mammogram every year.  Breast cancer gene (BRCA)-related cancer risk assessment is recommended for women who have family members with BRCA-related cancers. BRCA-related cancers include breast, ovarian, tubal, and peritoneal cancers. Having family members with these cancers may be associated with an increased risk for harmful changes (mutations) in the breast cancer genes BRCA1 and BRCA2. Results of the assessment will determine the need for genetic counseling and BRCA1 and BRCA2 testing.  The Pap test is a screening test for cervical cancer. Women should have a Pap test starting at age 66. Between ages 67 and 18, Pap tests should be repeated every 2 years. Beginning at age 54, you should have a Pap test every 3 years as long as the past 3 Pap tests have been normal. If you had a hysterectomy for a problem that was not cancer or a condition that could lead to cancer, then you no longer need Pap tests. If you are between ages 3 and 42, and you have had normal Pap tests going back 10 years, you no longer need Pap tests. If you have had past treatment for cervical cancer or a condition that could lead to cancer, you need Pap tests and screening for cancer for at least 20 years after your treatment. If Pap tests have been discontinued, risk factors (such as a new sexual partner) need to be reassessed to determine if screening should be resumed. Some women have medical problems  that increase the chance of getting cervical cancer. In these cases, your caregiver may recommend more frequent screening and Pap tests.  The human papillomavirus (HPV) test is an additional test that may be used for cervical cancer screening. The HPV test looks for the virus that can cause the cell changes on the cervix. The cells collected during the Pap test can be tested for HPV. The HPV test could be used to screen women aged 39 years and older, and should be used in women of any age who have unclear Pap test results. After the age of 29, women should have HPV testing at the same frequency as a Pap test.  Colorectal cancer can be detected and often prevented. Most routine colorectal cancer screening begins at the age of 76 and continues through age 52. However, your caregiver may recommend screening at an earlier age if you have risk factors for colon cancer. On a yearly basis, your caregiver may provide home test kits to check for hidden blood in the stool. Use of a small camera at the end of a tube, to directly examine the colon (sigmoidoscopy or colonoscopy), can detect the earliest forms of colorectal cancer. Talk to your caregiver about this at age 54, when routine screening begins. Direct examination of the colon should be repeated every 5 to 10 years through age 38, unless early forms of pre-cancerous polyps or small growths are found.  Hepatitis C blood testing is recommended for all people born from [birth year] through 1967 and any individual with known risks for hepatitis C.  Practice safe sex. Use condoms and avoid high-risk sexual practices to reduce the spread of sexually transmitted infections (STIs). Sexually active women aged 7 and younger should be checked for Chlamydia, which is a common sexually transmitted infection. Older women with new or multiple partners should also be tested for Chlamydia. Testing for other STIs is recommended if you are sexually active and at increased  risk.  Osteoporosis is a disease in which the bones lose minerals and strength with aging. This can result in serious bone fractures. The risk of osteoporosis can be identified using a bone density scan. Women ages 25 and over and women at risk for fractures or osteoporosis should discuss screening with their caregivers. Ask your caregiver whether you should be taking a calcium supplement or vitamin D to reduce the rate of osteoporosis.  Menopause can be associated with physical symptoms and risks. Hormone replacement therapy is available to decrease symptoms and risks. You should talk to your caregiver about whether hormone replacement therapy is right for you.  Use sunscreen. Apply sunscreen liberally and repeatedly throughout the day. You should seek shade when your shadow is shorter than you. Protect yourself by wearing long sleeves, pants, a wide-brimmed hat, and sunglasses year round, whenever you are outdoors.  Notify your caregiver of new moles or changes in moles, especially if there is a change in shape or color. Also notify your caregiver if a mole is larger than the size of a pencil eraser.  Stay current with your immunizations. Document Released: 12/03/2010 Document Revised: 09/14/2012 Document Reviewed: 12/03/2010 [facility] Patient Information 2014 [place], [place].

## 2013-06-02 ENCOUNTER — Ambulatory Visit: Payer: Managed Care, Other (non HMO) | Admitting: Psychology

## 2013-06-07 ENCOUNTER — Ambulatory Visit (INDEPENDENT_AMBULATORY_CARE_PROVIDER_SITE_OTHER): Payer: Managed Care, Other (non HMO) | Admitting: Psychology

## 2013-06-07 DIAGNOSIS — F4322 Adjustment disorder with anxiety: Secondary | ICD-10-CM

## 2013-06-22 ENCOUNTER — Ambulatory Visit (INDEPENDENT_AMBULATORY_CARE_PROVIDER_SITE_OTHER): Payer: Managed Care, Other (non HMO) | Admitting: Psychology

## 2013-06-22 DIAGNOSIS — F4322 Adjustment disorder with anxiety: Secondary | ICD-10-CM

## 2013-07-08 ENCOUNTER — Ambulatory Visit (INDEPENDENT_AMBULATORY_CARE_PROVIDER_SITE_OTHER): Payer: Managed Care, Other (non HMO) | Admitting: Psychology

## 2013-07-08 DIAGNOSIS — F4322 Adjustment disorder with anxiety: Secondary | ICD-10-CM

## 2013-07-23 ENCOUNTER — Ambulatory Visit (INDEPENDENT_AMBULATORY_CARE_PROVIDER_SITE_OTHER): Payer: Managed Care, Other (non HMO) | Admitting: Psychology

## 2013-07-23 DIAGNOSIS — F4322 Adjustment disorder with anxiety: Secondary | ICD-10-CM

## 2013-08-05 ENCOUNTER — Ambulatory Visit: Payer: Managed Care, Other (non HMO) | Admitting: Psychology

## 2013-08-31 ENCOUNTER — Ambulatory Visit (INDEPENDENT_AMBULATORY_CARE_PROVIDER_SITE_OTHER): Payer: Managed Care, Other (non HMO) | Admitting: Psychology

## 2013-08-31 DIAGNOSIS — F4322 Adjustment disorder with anxiety: Secondary | ICD-10-CM

## 2013-09-15 ENCOUNTER — Ambulatory Visit: Payer: Managed Care, Other (non HMO) | Admitting: Psychology

## 2013-09-29 ENCOUNTER — Ambulatory Visit: Payer: Managed Care, Other (non HMO) | Admitting: Psychology

## 2013-10-14 ENCOUNTER — Ambulatory Visit (INDEPENDENT_AMBULATORY_CARE_PROVIDER_SITE_OTHER): Payer: Managed Care, Other (non HMO) | Admitting: Psychology

## 2013-10-14 DIAGNOSIS — F4322 Adjustment disorder with anxiety: Secondary | ICD-10-CM

## 2013-11-08 ENCOUNTER — Ambulatory Visit (INDEPENDENT_AMBULATORY_CARE_PROVIDER_SITE_OTHER): Payer: Managed Care, Other (non HMO) | Admitting: Psychology

## 2013-11-08 DIAGNOSIS — F4322 Adjustment disorder with anxiety: Secondary | ICD-10-CM

## 2013-11-09 ENCOUNTER — Ambulatory Visit: Payer: Managed Care, Other (non HMO) | Admitting: Psychology

## 2013-11-16 ENCOUNTER — Other Ambulatory Visit: Payer: Self-pay | Admitting: Gastroenterology

## 2013-11-16 DIAGNOSIS — R109 Unspecified abdominal pain: Secondary | ICD-10-CM

## 2013-11-18 ENCOUNTER — Ambulatory Visit (INDEPENDENT_AMBULATORY_CARE_PROVIDER_SITE_OTHER): Payer: Managed Care, Other (non HMO) | Admitting: Psychology

## 2013-11-18 DIAGNOSIS — F4322 Adjustment disorder with anxiety: Secondary | ICD-10-CM

## 2013-11-25 ENCOUNTER — Encounter: Payer: Self-pay | Admitting: Internal Medicine

## 2013-11-25 ENCOUNTER — Ambulatory Visit (INDEPENDENT_AMBULATORY_CARE_PROVIDER_SITE_OTHER): Payer: Managed Care, Other (non HMO) | Admitting: Internal Medicine

## 2013-11-25 VITALS — BP 120/86 | HR 80 | Temp 98.6°F | Resp 20 | Ht 62.25 in | Wt 160.0 lb

## 2013-11-25 DIAGNOSIS — J069 Acute upper respiratory infection, unspecified: Secondary | ICD-10-CM

## 2013-11-25 DIAGNOSIS — B9789 Other viral agents as the cause of diseases classified elsewhere: Principal | ICD-10-CM

## 2013-11-25 MED ORDER — HYDROCODONE-HOMATROPINE 5-1.5 MG/5ML PO SYRP: 5.0000 mL | ORAL_SOLUTION | Freq: Four times a day (QID) | ORAL | Status: DC | PRN

## 2013-11-25 NOTE — Progress Notes (Signed)
Subjective:    Patient ID: Jessica Velazquez, female    DOB: April 23, 1981, 33 y.o.   MRN: [id number]  HPI  33 year old patient, who presents with a 5 day history of nonproductive cough that has intensified over the past 3 days.  Children and her father have been ill with similar symptoms.  Except for the cough.  She generally feels well without fever, shortness or breath, wheezing, sputum production.  Cough has interfered with work and sleep  Past Medical History  Diagnosis Date  . No pertinent past medical history     History   Social History  . Marital Status: Married    Spouse Name: N/A    Number of Children: N/A  . Years of Education: N/A   Occupational History  . Not on file.   Social History Main Topics  . Smoking status: Never Smoker   . Smokeless tobacco: Never Used  . Alcohol Use: No  . Drug Use: No  . Sexual Activity: Yes   Other Topics Concern  . Not on file   Social History Narrative  . No narrative on file    Past Surgical History  Procedure Laterality Date  . Dilation and curettage of uterus  09/2010    Family History  Problem Relation Age of Onset  . Arthritis Mother   . Hypertension Mother   . Hyperlipidemia Father   . Hypertension Father   . Diabetes Father   . Hypertension Brother     No Known Allergies  Current Outpatient Prescriptions on File Prior to Visit  Medication Sig Dispense Refill  . TRI-PREVIFEM 0.18/0.215/0.25 MG-35 MCG tablet        No current facility-administered medications on file prior to visit.    BP 120/86  Pulse 80  Temp(Src) 98.6 F (37 C) (Oral)  Resp 20  Ht 5' 2.25" (1.581 m)  Wt 160 lb (72.576 kg)  BMI 29.04 kg/m2  SpO2 98%       Review of Systems  Constitutional: Negative.   HENT: Negative for congestion, dental problem, hearing loss, rhinorrhea, sinus pressure, sore throat and tinnitus.   Eyes: Negative for pain, discharge and visual disturbance.  Respiratory: Positive for cough. Negative  for shortness of breath.   Cardiovascular: Negative for chest pain, palpitations and leg swelling.  Gastrointestinal: Negative for nausea, vomiting, abdominal pain, diarrhea, constipation, blood in stool and abdominal distention.  Genitourinary: Negative for dysuria, urgency, frequency, hematuria, flank pain, vaginal bleeding, vaginal discharge, difficulty urinating, vaginal pain and pelvic pain.  Musculoskeletal: Negative for arthralgias, gait problem and joint swelling.  Skin: Negative for rash.  Neurological: Negative for dizziness, syncope, speech difficulty, weakness, numbness and headaches.  Hematological: Negative for adenopathy.  Psychiatric/Behavioral: Negative for behavioral problems, dysphoric mood and agitation. The patient is not nervous/anxious.        Objective:   Physical Exam  Constitutional: She is oriented to person, place, and time. She appears well-developed and well-nourished.  HENT:  Head: Normocephalic.  Right Ear: External ear normal.  Left Ear: External ear normal.  Mouth/Throat: Oropharynx is clear and moist.  Eyes: Conjunctivae and EOM are normal. Pupils are equal, round, and reactive to light.  Neck: Normal range of motion. Neck supple. No thyromegaly present.  Cardiovascular: Normal rate, regular rhythm, normal heart sounds and intact distal pulses.   Pulmonary/Chest: Effort normal and breath sounds normal.  Abdominal: Soft. Bowel sounds are normal. She exhibits no mass. There is no tenderness.  Musculoskeletal: Normal range of motion.  Lymphadenopathy:  She has no cervical adenopathy.  Neurological: She is alert and oriented to person, place, and time.  Skin: Skin is warm and dry. No rash noted.  Psychiatric: She has a normal mood and affect. Her behavior is normal.          Assessment & Plan:  Viral URI with cough. We'll treat symptomatically

## 2013-11-25 NOTE — Progress Notes (Signed)
Pre-visit discussion using our clinic review tool. No additional management support is needed unless otherwise documented below in the visit note.  

## 2013-11-25 NOTE — Patient Instructions (Signed)
Acute bronchitis symptoms for less than 10 days are generally not helped by antibiotics.  Take over-the-counter expectorants and cough medications such as  Mucinex DM.  Call if there is no improvement in 5 to 7 days or if  you develop worsening cough, fever, or new symptoms, such as shortness of breath or chest pain.   

## 2013-12-07 ENCOUNTER — Ambulatory Visit (HOSPITAL_COMMUNITY)
Admission: RE | Admit: 2013-12-07 | Discharge: 2013-12-07 | Disposition: A | Discharge status: Home / Self Care | Payer: Managed Care, Other (non HMO) | Source: Ambulatory Visit | Attending: Gastroenterology | Admitting: Gastroenterology

## 2013-12-07 ENCOUNTER — Encounter (HOSPITAL_COMMUNITY)
Admission: RE | Admit: 2013-12-07 | Discharge: 2013-12-07 | Disposition: A | Discharge status: Home / Self Care | Payer: Managed Care, Other (non HMO) | Source: Ambulatory Visit | Attending: Gastroenterology | Admitting: Gastroenterology

## 2013-12-07 DIAGNOSIS — R109 Unspecified abdominal pain: Secondary | ICD-10-CM | POA: Insufficient documentation

## 2013-12-07 MED ORDER — TECHNETIUM TC 99M MEBROFENIN IV KIT
5.5000 | PACK | Freq: Once | INTRAVENOUS | Status: AC | PRN
Start: 2013-12-07 — End: 2013-12-07
  Administered 2013-12-07: 5.5 via INTRAVENOUS

## 2013-12-07 MED ORDER — SINCALIDE 5 MCG IJ SOLR
0.0200 ug/kg | Freq: Once | INTRAMUSCULAR | Status: AC
  Administered 2013-12-07: 1.5 ug via INTRAVENOUS

## 2014-01-05 ENCOUNTER — Ambulatory Visit (INDEPENDENT_AMBULATORY_CARE_PROVIDER_SITE_OTHER): Payer: Managed Care, Other (non HMO) | Admitting: Psychology

## 2014-01-05 DIAGNOSIS — F4322 Adjustment disorder with anxiety: Secondary | ICD-10-CM

## 2014-03-07 ENCOUNTER — Ambulatory Visit: Payer: Managed Care, Other (non HMO) | Admitting: Psychology

## 2014-03-18 ENCOUNTER — Ambulatory Visit: Payer: Managed Care, Other (non HMO) | Admitting: Psychology

## 2014-03-21 ENCOUNTER — Ambulatory Visit: Payer: Managed Care, Other (non HMO) | Admitting: Psychology

## 2014-03-30 ENCOUNTER — Ambulatory Visit (INDEPENDENT_AMBULATORY_CARE_PROVIDER_SITE_OTHER): Payer: Managed Care, Other (non HMO) | Admitting: Psychology

## 2014-03-30 DIAGNOSIS — F4322 Adjustment disorder with anxiety: Secondary | ICD-10-CM

## 2014-04-04 ENCOUNTER — Encounter: Payer: Self-pay | Admitting: Internal Medicine

## 2014-04-21 ENCOUNTER — Telehealth: Payer: Self-pay | Admitting: Internal Medicine

## 2014-04-21 NOTE — Telephone Encounter (Signed)
Pt will wait until jan 2016

## 2014-04-21 NOTE — Telephone Encounter (Signed)
Pt needs cpx before end of yr. Can I create 30 min appt?

## 2014-04-21 NOTE — Telephone Encounter (Signed)
If there is space.

## 2014-05-09 ENCOUNTER — Ambulatory Visit (INDEPENDENT_AMBULATORY_CARE_PROVIDER_SITE_OTHER): Payer: Managed Care, Other (non HMO) | Admitting: Psychology

## 2014-05-09 DIAGNOSIS — F4322 Adjustment disorder with anxiety: Secondary | ICD-10-CM

## 2014-06-09 ENCOUNTER — Ambulatory Visit (INDEPENDENT_AMBULATORY_CARE_PROVIDER_SITE_OTHER): Payer: BLUE CROSS/BLUE SHIELD | Admitting: Psychology

## 2014-06-09 DIAGNOSIS — F4322 Adjustment disorder with anxiety: Secondary | ICD-10-CM

## 2014-06-10 ENCOUNTER — Other Ambulatory Visit (INDEPENDENT_AMBULATORY_CARE_PROVIDER_SITE_OTHER): Payer: BLUE CROSS/BLUE SHIELD

## 2014-06-10 DIAGNOSIS — Z0000 Encounter for general adult medical examination without abnormal findings: Secondary | ICD-10-CM

## 2014-06-10 LAB — CBC WITH DIFFERENTIAL/PLATELET
BASOS ABS: 0 10*3/uL (ref 0.0–0.1)
BASOS PCT: 0.5 % (ref 0.0–3.0)
Eosinophils Absolute: 0.1 10*3/uL (ref 0.0–0.7)
Eosinophils Relative: 1.4 % (ref 0.0–5.0)
HCT: 40.1 % (ref 36.0–46.0)
HEMOGLOBIN: 13.6 g/dL (ref 12.0–15.0)
LYMPHS PCT: 23.7 % (ref 12.0–46.0)
Lymphs Abs: 1.8 10*3/uL (ref 0.7–4.0)
MCHC: 34 g/dL (ref 30.0–36.0)
MCV: 89.4 fl (ref 78.0–100.0)
MONO ABS: 0.5 10*3/uL (ref 0.1–1.0)
MONOS PCT: 6.2 % (ref 3.0–12.0)
Neutro Abs: 5.1 10*3/uL (ref 1.4–7.7)
Neutrophils Relative %: 68.2 % (ref 43.0–77.0)
PLATELETS: 283 10*3/uL (ref 150.0–400.0)
RBC: 4.48 Mil/uL (ref 3.87–5.11)
RDW: 13.1 % (ref 11.5–15.5)
WBC: 7.6 10*3/uL (ref 4.0–10.5)

## 2014-06-10 LAB — POCT URINALYSIS DIPSTICK
BILIRUBIN UA: NEGATIVE
Glucose, UA: NEGATIVE
KETONES UA: NEGATIVE
Leukocytes, UA: NEGATIVE
Nitrite, UA: NEGATIVE
PH UA: 6
RBC UA: NEGATIVE
UROBILINOGEN UA: 0.2

## 2014-06-10 LAB — HEPATIC FUNCTION PANEL
ALT: 18 U/L (ref 0–35)
AST: 16 U/L (ref 0–37)
Albumin: 3.9 g/dL (ref 3.5–5.2)
Alkaline Phosphatase: 57 U/L (ref 39–117)
BILIRUBIN DIRECT: 0 mg/dL (ref 0.0–0.3)
TOTAL PROTEIN: 7.3 g/dL (ref 6.0–8.3)
Total Bilirubin: 0.5 mg/dL (ref 0.2–1.2)

## 2014-06-10 LAB — LIPID PANEL
CHOL/HDL RATIO: 5
Cholesterol: 183 mg/dL (ref 0–200)
HDL: 34.9 mg/dL — AB (ref >39.00)
LDL CALC: 130 mg/dL — AB (ref 0–99)
NONHDL: 148.1
Triglycerides: 92 mg/dL (ref 0.0–149.0)
VLDL: 18.4 mg/dL (ref 0.0–40.0)

## 2014-06-10 LAB — BASIC METABOLIC PANEL
BUN: 11 mg/dL (ref 6–23)
CO2: 26 mEq/L (ref 19–32)
Calcium: 8.9 mg/dL (ref 8.4–10.5)
Chloride: 107 mEq/L (ref 96–112)
Creatinine, Ser: 0.6 mg/dL (ref 0.4–1.2)
GFR: 118.54 mL/min (ref >60.00)
Glucose, Bld: 102 mg/dL — ABNORMAL HIGH (ref 70–99)
Potassium: 3.8 mEq/L (ref 3.5–5.1)
Sodium: 139 mEq/L (ref 135–145)

## 2014-06-10 LAB — TSH: TSH: 1.39 u[IU]/mL (ref 0.35–4.50)

## 2014-06-16 ENCOUNTER — Encounter: Payer: Managed Care, Other (non HMO) | Admitting: Internal Medicine

## 2014-06-17 ENCOUNTER — Ambulatory Visit (INDEPENDENT_AMBULATORY_CARE_PROVIDER_SITE_OTHER): Payer: BLUE CROSS/BLUE SHIELD | Admitting: Internal Medicine

## 2014-06-17 ENCOUNTER — Encounter: Payer: Self-pay | Admitting: Internal Medicine

## 2014-06-17 VITALS — BP 120/76 | HR 79 | Temp 98.3°F | Resp 20 | Ht 62.0 in | Wt 163.0 lb

## 2014-06-17 DIAGNOSIS — Z0000 Encounter for general adult medical examination without abnormal findings: Secondary | ICD-10-CM

## 2014-06-17 NOTE — Patient Instructions (Signed)
You need to lose weight.  Consider a lower calorie diet and regular exercise.  Health Maintenance Adopting a healthy lifestyle and getting preventive care can go a long way to promote health and wellness. Talk with your health care provider about what schedule of regular examinations is right for you. This is a good chance for you to check in with your provider about disease prevention and staying healthy. In between checkups, there are plenty of things you can do on your own. Experts have done a lot of research about which lifestyle changes and preventive measures are most likely to keep you healthy. Ask your health care provider for more information. WEIGHT AND DIET  Eat a healthy diet  Be sure to include plenty of vegetables, fruits, low-fat dairy products, and lean protein.  Do not eat a lot of foods high in solid fats, added sugars, or salt.  Get regular exercise. This is one of the most important things you can do for your health.  Most adults should exercise for at least 150 minutes each week. The exercise should increase your heart rate and make you sweat (moderate-intensity exercise).  Most adults should also do strengthening exercises at least twice a week. This is in addition to the moderate-intensity exercise.  Maintain a healthy weight  Body mass index (BMI) is a measurement that can be used to identify possible weight problems. It estimates body fat based on height and weight. Your health care provider can help determine your BMI and help you achieve or maintain a healthy weight.  For females 53 years of age and older:   A BMI below 18.5 is considered underweight.  A BMI of 18.5 to 24.9 is normal.  A BMI of 25 to 29.9 is considered overweight.  A BMI of 30 and above is considered obese.  Watch levels of cholesterol and blood lipids  You should start having your blood tested for lipids and cholesterol at 36 years of age, then have this test every 5 years.  You may  need to have your cholesterol levels checked more often if:  Your lipid or cholesterol levels are high.  You are older than 36 years of age.  You are at high risk for heart disease.  CANCER SCREENING   Lung Cancer  Lung cancer screening is recommended for adults 32-68 years old who are at high risk for lung cancer because of a history of smoking.  A yearly low-dose CT scan of the lungs is recommended for people who:  Currently smoke.  Have quit within the past 15 years.  Have at least a 30-pack-year history of smoking. A pack year is smoking an average of one pack of cigarettes a day for 1 year.  Yearly screening should continue until it has been 15 years since you quit.  Yearly screening should stop if you develop a health problem that would prevent you from having lung cancer treatment.  Breast Cancer  Practice breast self-awareness. This means understanding how your breasts normally appear and feel.  It also means doing regular breast self-exams. Let your health care provider know about any changes, no matter how small.  If you are in your 20s or 30s, you should have a clinical breast exam (CBE) by a health care provider every 1-3 years as part of a regular health exam.  If you are 79 or older, have a CBE every year. Also consider having a breast X-ray (mammogram) every year.  If you have a family history of  breast cancer, talk to your health care provider about genetic screening.  If you are at high risk for breast cancer, talk to your health care provider about having an MRI and a mammogram every year.  Breast cancer gene (BRCA) assessment is recommended for women who have family members with BRCA-related cancers. BRCA-related cancers include:  Breast.  Ovarian.  Tubal.  Peritoneal cancers.  Results of the assessment will determine the need for genetic counseling and BRCA1 and BRCA2 testing. Cervical Cancer Routine pelvic examinations to screen for cervical  cancer are no longer recommended for nonpregnant women who are considered low risk for cancer of the pelvic organs (ovaries, uterus, and vagina) and who do not have symptoms. A pelvic examination may be necessary if you have symptoms including those associated with pelvic infections. Ask your health care provider if a screening pelvic exam is right for you.   The Pap test is the screening test for cervical cancer for women who are considered at risk.  If you had a hysterectomy for a problem that was not cancer or a condition that could lead to cancer, then you no longer need Pap tests.  If you are older than 65 years, and you have had normal Pap tests for the past 10 years, you no longer need to have Pap tests.  If you have had past treatment for cervical cancer or a condition that could lead to cancer, you need Pap tests and screening for cancer for at least 20 years after your treatment.  If you no longer get a Pap test, assess your risk factors if they change (such as having a new sexual partner). This can affect whether you should start being screened again.  Some women have medical problems that increase their chance of getting cervical cancer. If this is the case for you, your health care provider may recommend more frequent screening and Pap tests.  The human papillomavirus (HPV) test is another test that may be used for cervical cancer screening. The HPV test looks for the virus that can cause cell changes in the cervix. The cells collected during the Pap test can be tested for HPV.  The HPV test can be used to screen women 48 years of age and older. Getting tested for HPV can extend the interval between normal Pap tests from three to five years.  An HPV test also should be used to screen women of any age who have unclear Pap test results.  After 36 years of age, women should have HPV testing as often as Pap tests.  Colorectal Cancer  This type of cancer can be detected and often  prevented.  Routine colorectal cancer screening usually begins at 36 years of age and continues through 36 years of age.  Your health care provider may recommend screening at an earlier age if you have risk factors for colon cancer.  Your health care provider may also recommend using home test kits to check for hidden blood in the stool.  A small camera at the end of a tube can be used to examine your colon directly (sigmoidoscopy or colonoscopy). This is done to check for the earliest forms of colorectal cancer.  Routine screening usually begins at age 50.  Direct examination of the colon should be repeated every 5-10 years through 36 years of age. However, you may need to be screened more often if early forms of precancerous polyps or small growths are found. Skin Cancer  Check your skin from head to  toe regularly.  Tell your health care provider about any new moles or changes in moles, especially if there is a change in a mole's shape or color.  Also tell your health care provider if you have a mole that is larger than the size of a pencil eraser.  Always use sunscreen. Apply sunscreen liberally and repeatedly throughout the day.  Protect yourself by wearing long sleeves, pants, a wide-brimmed hat, and sunglasses whenever you are outside. HEART DISEASE, DIABETES, AND HIGH BLOOD PRESSURE   Have your blood pressure checked at least every 1-2 years. High blood pressure causes heart disease and increases the risk of stroke.  If you are between 39 years and 24 years old, ask your health care provider if you should take aspirin to prevent strokes.  Have regular diabetes screenings. This involves taking a blood sample to check your fasting blood sugar level.  If you are at a normal weight and have a low risk for diabetes, have this test once every three years after 36 years of age.  If you are overweight and have a high risk for diabetes, consider being tested at a younger age or more  often. PREVENTING INFECTION  Hepatitis B  If you have a higher risk for hepatitis B, you should be screened for this virus. You are considered at high risk for hepatitis B if:  You were born in a country where hepatitis B is common. Ask your health care provider which countries are considered high risk.  Your parents were born in a high-risk country, and you have not been immunized against hepatitis B (hepatitis B vaccine).  You have HIV or AIDS.  You use needles to inject street drugs.  You live with someone who has hepatitis B.  You have had sex with someone who has hepatitis B.  You get hemodialysis treatment.  You take certain medicines for conditions, including cancer, organ transplantation, and autoimmune conditions. Hepatitis C  Blood testing is recommended for:  Everyone born from [birth year] through 1967.  Anyone with known risk factors for hepatitis C. Sexually transmitted infections (STIs)  You should be screened for sexually transmitted infections (STIs) including gonorrhea and chlamydia if:  You are sexually active and are younger than 36 years of age.  You are older than 36 years of age and your health care provider tells you that you are at risk for this type of infection.  Your sexual activity has changed since you were last screened and you are at an increased risk for chlamydia or gonorrhea. Ask your health care provider if you are at risk.  If you do not have HIV, but are at risk, it may be recommended that you take a prescription medicine daily to prevent HIV infection. This is called pre-exposure prophylaxis (PrEP). You are considered at risk if:  You are sexually active and do not regularly use condoms or know the HIV status of your partner(s).  You take drugs by injection.  You are sexually active with a partner who has HIV. Talk with your health care provider about whether you are at high risk of being infected with HIV. If you choose to begin PrEP, you  should first be tested for HIV. You should then be tested every 3 months for as long as you are taking PrEP.  PREGNANCY   If you are premenopausal and you may become pregnant, ask your health care provider about preconception counseling.  If you may become pregnant, take 400 to 800 micrograms (mcg)  of folic acid every day.  If you want to prevent pregnancy, talk to your health care provider about birth control (contraception). OSTEOPOROSIS AND MENOPAUSE   Osteoporosis is a disease in which the bones lose minerals and strength with aging. This can result in serious bone fractures. Your risk for osteoporosis can be identified using a bone density scan.  If you are 68 years of age or older, or if you are at risk for osteoporosis and fractures, ask your health care provider if you should be screened.  Ask your health care provider whether you should take a calcium or vitamin D supplement to lower your risk for osteoporosis.  Menopause may have certain physical symptoms and risks.  Hormone replacement therapy may reduce some of these symptoms and risks. Talk to your health care provider about whether hormone replacement therapy is right for you.  HOME CARE INSTRUCTIONS   Schedule regular health, dental, and eye exams.  Stay current with your immunizations.   Do not use any tobacco products including cigarettes, chewing tobacco, or electronic cigarettes.  If you are pregnant, do not drink alcohol.  If you are breastfeeding, limit how much and how often you drink alcohol.  Limit alcohol intake to no more than 1 drink per day for nonpregnant women. One drink equals 12 ounces of beer, 5 ounces of wine, or 1 ounces of hard liquor.  Do not use street drugs.  Do not share needles.  Ask your health care provider for help if you need support or information about quitting drugs.  Tell your health care provider if you often feel depressed.  Tell your health care provider if you have ever  been abused or do not feel safe at home. Document Released: 12/03/2010 Document Revised: 10/04/2013 Document Reviewed: 04/21/2013 [facility] Patient Information 2015 [place], [place]. This information is not intended to replace advice given to you by your health care provider. Make sure you discuss any questions you have with your health care provider.

## 2014-06-17 NOTE — Progress Notes (Signed)
Subjective:    Patient ID: Jessica Velazquez, female    DOB: 03/09/1981, 34 y.o.   MRN: [id number]  HPI  Patient ID: Jessica Velazquez, female   DOB: 03/09/1981, 34 y.o.   MRN: [id number]  Subjective:    Patient ID: Jessica Velazquez, female    DOB: 03/09/1981, 34 y.o.   MRN: [id number]  HPI 34 -year-old patient who is seen today for a preventive health examination.   She is followed by Dr. Ambrose MantleHenley after a recent pregnancy. There is a followup Pap planned- apparently history of an abnormal Pap in [place] in the past. No concerns or complaints.  Past medical history is unremarkable. 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 Nonsmoker married 2 children  Family history father has a history of dyslipidemia hypertension and diabetes. Father status post cholangitis and recent cholecystectomy.  Mother also has a history of hypertension One brother also with treated hypertension Paternal grandmother with breast cancer  Past Medical History  Diagnosis Date  . No pertinent past medical history     History   Social History  . Marital Status: Married    Spouse Name: N/A    Number of Children: N/A  . Years of Education: N/A   Occupational History  . Not on file.   Social History Main Topics  . Smoking status: Never Smoker   . Smokeless tobacco: Never Used  . Alcohol Use: No  . Drug Use: No  . Sexual Activity: Yes   Other Topics Concern  . Not on file   Social History Narrative    Past Surgical History  Procedure Laterality Date  . Dilation and curettage of uterus  09/2010    Family History  Problem Relation Age of Onset  . Arthritis Mother   . Hypertension Mother   . Hyperlipidemia Father   . Hypertension Father   . Diabetes Father   . Hypertension Brother     No Known Allergies  No  current outpatient prescriptions on file prior to visit.   No current facility-administered medications on file prior to visit.    BP 120/76 mmHg  Pulse 79  Temp(Src) 98.3 F (36.8 C) (Oral)  Resp 20  Ht 5\' 2"  (1.575 m)  Wt 163 lb (73.936 kg)  BMI 29.81 kg/m2  SpO2 97%  LMP 06/27/2013        Review of Systems  Constitutional: Negative.   HENT: Negative for congestion, dental problem, hearing loss, rhinorrhea, sinus pressure, sore throat and tinnitus.   Eyes: Negative for pain, discharge and visual disturbance.  Respiratory: Negative for cough and shortness of breath.   Cardiovascular: Negative for chest pain, palpitations and leg swelling.  Gastrointestinal: Negative for nausea, vomiting, abdominal pain, diarrhea, constipation, blood in stool and abdominal distention.  Genitourinary: Negative for dysuria, urgency, frequency, hematuria, flank pain, vaginal bleeding, vaginal discharge, difficulty urinating, vaginal pain and pelvic pain.  Musculoskeletal: Negative for arthralgias, gait problem and joint swelling.  Skin: Negative for rash.  Neurological: Negative for dizziness, syncope, speech difficulty, weakness, numbness and headaches.  Hematological: Negative for adenopathy.  Psychiatric/Behavioral: Negative for behavioral problems, dysphoric mood and agitation. The patient is not nervous/anxious.        Objective:   Physical Exam  Constitutional: She is oriented to person, place, and time. She appears well-developed  and well-nourished.  Weight 172 Blood pressure low normal  HENT:  Head: Normocephalic.  Right Ear: External ear normal.  Left Ear: External ear normal.  Mouth/Throat: Oropharynx is clear and moist.  Eyes: Conjunctivae and EOM are normal. Pupils are equal, round, and reactive to light.  Neck: Normal range of motion. Neck supple. No thyromegaly present.  Cardiovascular: Normal rate, regular rhythm, normal heart sounds and intact distal pulses.     Pulmonary/Chest: Effort normal and breath sounds normal.  Abdominal: Soft. Bowel sounds are normal. She exhibits no mass. There is no tenderness.  Musculoskeletal: Normal range of motion.  Lymphadenopathy:    She has no cervical adenopathy.  Neurological: She is alert and oriented to person, place, and time.  Skin: Skin is warm and dry. No rash noted.  Psychiatric: She has a normal mood and affect. Her behavior is normal.          Assessment & Plan:   Preventive health exam Followup gynecology  Return here when necessary  Review of Systems As above    Objective:   Physical Exam  As above      Assessment & Plan:   Preventive health exam Modest weight loss and exercise regimen.  Encouraged Gynecologic follow-up recommended Return in one year or as needed

## 2014-06-17 NOTE — Progress Notes (Signed)
Pre visit review using our clinic review tool, if applicable. No additional management support is needed unless otherwise documented below in the visit note. 

## 2014-06-20 ENCOUNTER — Ambulatory Visit: Payer: BLUE CROSS/BLUE SHIELD | Admitting: Psychology

## 2014-06-24 ENCOUNTER — Ambulatory Visit: Payer: BLUE CROSS/BLUE SHIELD | Admitting: Psychology

## 2014-08-02 ENCOUNTER — Ambulatory Visit (INDEPENDENT_AMBULATORY_CARE_PROVIDER_SITE_OTHER): Payer: BLUE CROSS/BLUE SHIELD | Admitting: Psychology

## 2014-08-02 DIAGNOSIS — F4322 Adjustment disorder with anxiety: Secondary | ICD-10-CM | POA: Diagnosis not present

## 2014-08-11 ENCOUNTER — Ambulatory Visit (INDEPENDENT_AMBULATORY_CARE_PROVIDER_SITE_OTHER): Payer: BLUE CROSS/BLUE SHIELD | Admitting: Psychology

## 2014-08-11 DIAGNOSIS — F4322 Adjustment disorder with anxiety: Secondary | ICD-10-CM | POA: Diagnosis not present

## 2014-08-15 ENCOUNTER — Ambulatory Visit (INDEPENDENT_AMBULATORY_CARE_PROVIDER_SITE_OTHER): Payer: BLUE CROSS/BLUE SHIELD | Admitting: Psychology

## 2014-08-15 DIAGNOSIS — F4322 Adjustment disorder with anxiety: Secondary | ICD-10-CM | POA: Diagnosis not present

## 2014-08-31 ENCOUNTER — Ambulatory Visit (INDEPENDENT_AMBULATORY_CARE_PROVIDER_SITE_OTHER): Payer: BLUE CROSS/BLUE SHIELD | Admitting: Psychology

## 2014-08-31 DIAGNOSIS — F4322 Adjustment disorder with anxiety: Secondary | ICD-10-CM

## 2014-09-21 ENCOUNTER — Ambulatory Visit: Payer: BLUE CROSS/BLUE SHIELD | Admitting: Psychology

## 2014-09-28 ENCOUNTER — Ambulatory Visit: Payer: BLUE CROSS/BLUE SHIELD | Admitting: Psychology

## 2014-09-29 ENCOUNTER — Ambulatory Visit (INDEPENDENT_AMBULATORY_CARE_PROVIDER_SITE_OTHER): Payer: BLUE CROSS/BLUE SHIELD | Admitting: Psychology

## 2014-09-29 DIAGNOSIS — F4322 Adjustment disorder with anxiety: Secondary | ICD-10-CM

## 2014-10-17 ENCOUNTER — Ambulatory Visit: Payer: BLUE CROSS/BLUE SHIELD | Admitting: Psychology

## 2014-10-24 ENCOUNTER — Ambulatory Visit (INDEPENDENT_AMBULATORY_CARE_PROVIDER_SITE_OTHER): Payer: BLUE CROSS/BLUE SHIELD | Admitting: Psychology

## 2014-10-24 DIAGNOSIS — F4322 Adjustment disorder with anxiety: Secondary | ICD-10-CM

## 2015-02-13 ENCOUNTER — Ambulatory Visit (INDEPENDENT_AMBULATORY_CARE_PROVIDER_SITE_OTHER): Payer: BLUE CROSS/BLUE SHIELD | Admitting: Psychology

## 2015-02-13 DIAGNOSIS — F4322 Adjustment disorder with anxiety: Secondary | ICD-10-CM | POA: Diagnosis not present

## 2015-02-28 ENCOUNTER — Ambulatory Visit: Payer: BLUE CROSS/BLUE SHIELD | Admitting: Psychology

## 2015-03-01 ENCOUNTER — Ambulatory Visit: Payer: BLUE CROSS/BLUE SHIELD | Admitting: Psychology

## 2015-03-20 ENCOUNTER — Ambulatory Visit (INDEPENDENT_AMBULATORY_CARE_PROVIDER_SITE_OTHER): Payer: BLUE CROSS/BLUE SHIELD | Admitting: Psychology

## 2015-03-20 DIAGNOSIS — F4322 Adjustment disorder with anxiety: Secondary | ICD-10-CM

## 2015-04-03 ENCOUNTER — Ambulatory Visit (INDEPENDENT_AMBULATORY_CARE_PROVIDER_SITE_OTHER): Payer: BLUE CROSS/BLUE SHIELD | Admitting: Psychology

## 2015-04-03 DIAGNOSIS — F4322 Adjustment disorder with anxiety: Secondary | ICD-10-CM

## 2015-04-04 ENCOUNTER — Ambulatory Visit: Payer: Self-pay | Admitting: Internal Medicine

## 2015-04-04 ENCOUNTER — Ambulatory Visit (INDEPENDENT_AMBULATORY_CARE_PROVIDER_SITE_OTHER): Payer: BLUE CROSS/BLUE SHIELD | Admitting: Family Medicine

## 2015-04-04 ENCOUNTER — Encounter: Payer: Self-pay | Admitting: Family Medicine

## 2015-04-04 VITALS — BP 110/82 | HR 67 | Temp 98.8°F | Ht 62.0 in | Wt 158.0 lb

## 2015-04-04 DIAGNOSIS — J069 Acute upper respiratory infection, unspecified: Secondary | ICD-10-CM

## 2015-04-04 MED ORDER — HYDROCODONE-HOMATROPINE 5-1.5 MG/5ML PO SYRP: 5.0000 mL | ORAL_SOLUTION | ORAL | Status: DC | PRN

## 2015-04-04 NOTE — Progress Notes (Signed)
Pre visit review using our clinic review tool, if applicable. No additional management support is needed unless otherwise documented below in the visit note. 

## 2015-04-04 NOTE — Progress Notes (Signed)
   Subjective:    Patient ID: Jessica Velazquez, female    DOB: [birth year], 34 y.o.   MRN: [id number]  HPI Here for 5 days of a dry cough and some PND. No sinus pressure or ST or fever. Her son has the same symptoms and as been diagnosed with a viral infection.    Review of Systems  Constitutional: Negative.   HENT: Positive for postnasal drip. Negative for congestion, ear pain, sinus pressure and sore throat.   Eyes: Negative.   Respiratory: Positive for cough. Negative for shortness of breath and wheezing.   Cardiovascular: Negative.        Objective:   Physical Exam  Constitutional: She appears well-developed and well-nourished.  HENT:  Right Ear: External ear normal.  Left Ear: External ear normal.  Nose: Nose normal.  Mouth/Throat: Oropharynx is clear and moist.  Eyes: Conjunctivae are normal.  Neck: No thyromegaly present.  Pulmonary/Chest: Effort normal and breath sounds normal. No respiratory distress. She has no wheezes. She has no rales.  Lymphadenopathy:    She has no cervical adenopathy.          Assessment & Plan:  Viral URI. Rest, drink fluids. Use cough syrup prn

## 2015-04-12 ENCOUNTER — Ambulatory Visit (INDEPENDENT_AMBULATORY_CARE_PROVIDER_SITE_OTHER): Payer: BLUE CROSS/BLUE SHIELD | Admitting: Psychology

## 2015-04-12 DIAGNOSIS — F4322 Adjustment disorder with anxiety: Secondary | ICD-10-CM

## 2015-04-14 ENCOUNTER — Ambulatory Visit: Payer: BLUE CROSS/BLUE SHIELD | Admitting: Psychology

## 2015-04-21 ENCOUNTER — Ambulatory Visit: Payer: BLUE CROSS/BLUE SHIELD | Admitting: Psychology

## 2015-04-26 ENCOUNTER — Ambulatory Visit (INDEPENDENT_AMBULATORY_CARE_PROVIDER_SITE_OTHER): Payer: BLUE CROSS/BLUE SHIELD | Admitting: Psychology

## 2015-04-26 DIAGNOSIS — F4322 Adjustment disorder with anxiety: Secondary | ICD-10-CM | POA: Diagnosis not present

## 2015-05-26 ENCOUNTER — Ambulatory Visit (INDEPENDENT_AMBULATORY_CARE_PROVIDER_SITE_OTHER): Payer: BLUE CROSS/BLUE SHIELD | Admitting: Internal Medicine

## 2015-05-26 ENCOUNTER — Encounter: Payer: Self-pay | Admitting: Internal Medicine

## 2015-05-26 VITALS — BP 110/70 | HR 70 | Temp 98.7°F | Resp 20 | Ht 62.0 in | Wt 157.0 lb

## 2015-05-26 DIAGNOSIS — J069 Acute upper respiratory infection, unspecified: Secondary | ICD-10-CM

## 2015-05-26 NOTE — Progress Notes (Signed)
Pre visit review using our clinic review tool, if applicable. No additional management support is needed unless otherwise documented below in the visit note. 

## 2015-05-26 NOTE — Progress Notes (Signed)
Subjective:    Patient ID: Jessica Velazquez, female    DOB: 1981-01-08, 34 y.o.   MRN: [id number]  HPI   47 year old patient mother of 2 young children. Her daughter has been diagnosed with a recent ear infection that developed after a viral syndrome. The patient yesterday had a sense of unwellness with weakness and has developed some mild pressure in both ears.  She was concerned about a possible ear infection.  They will be leaving town next week for a ski trip.  Past Medical History  Diagnosis Date  . No pertinent past medical history     Social History   Social History  . Marital Status: Married    Spouse Name: N/A  . Number of Children: N/A  . Years of Education: N/A   Occupational History  . Not on file.   Social History Main Topics  . Smoking status: Never Smoker   . Smokeless tobacco: Never Used  . Alcohol Use: No  . Drug Use: No  . Sexual Activity: Yes   Other Topics Concern  . Not on file   Social History Narrative    Past Surgical History  Procedure Laterality Date  . Dilation and curettage of uterus  09/2010    Family History  Problem Relation Age of Onset  . Arthritis Mother   . Hypertension Mother   . Hyperlipidemia Father   . Hypertension Father   . Diabetes Father   . Hypertension Brother     No Known Allergies  No current outpatient prescriptions on file prior to visit.   No current facility-administered medications on file prior to visit.    BP 110/70 mmHg  Pulse 70  Temp(Src) 98.7 F (37.1 C) (Oral)  Resp 20  Ht  (1.575 m)  Wt 157 lb (71.215 kg)  BMI 28.71 kg/m2  SpO2 98%     Review of Systems  Constitutional: Positive for activity change, appetite change and fatigue.  HENT: Positive for congestion. Negative for dental problem, hearing loss, rhinorrhea, sinus pressure, sore throat and tinnitus.   Eyes: Negative for pain, discharge and visual disturbance.  Respiratory: Positive for cough. Negative for shortness  of breath.   Cardiovascular: Negative for chest pain, palpitations and leg swelling.  Gastrointestinal: Negative for nausea, vomiting, abdominal pain, diarrhea, constipation, blood in stool and abdominal distention.  Genitourinary: Negative for dysuria, urgency, frequency, hematuria, flank pain, vaginal bleeding, vaginal discharge, difficulty urinating, vaginal pain and pelvic pain.  Musculoskeletal: Negative for joint swelling, arthralgias and gait problem.  Skin: Negative for rash.  Neurological: Negative for dizziness, syncope, speech difficulty, weakness, numbness and headaches.  Hematological: Negative for adenopathy.  Psychiatric/Behavioral: Negative for behavioral problems, dysphoric mood and agitation. The patient is not nervous/anxious.        Objective:   Physical Exam  Constitutional: She is oriented to person, place, and time. She appears well-developed and well-nourished.  HENT:  Head: Normocephalic.  Right Ear: External ear normal.  Left Ear: External ear normal.  Mouth/Throat: Oropharynx is clear and moist.  Both tympanic membranes normal  Eyes: Conjunctivae and EOM are normal. Pupils are equal, round, and reactive to light.  Neck: Normal range of motion. Neck supple. No thyromegaly present.  Cardiovascular: Normal rate, regular rhythm, normal heart sounds and intact distal pulses.   Pulmonary/Chest: Effort normal and breath sounds normal.  Abdominal: Soft. Bowel sounds are normal. She exhibits no mass. There is no tenderness.  Musculoskeletal: Normal range of motion.  Lymphadenopathy:  She has no cervical adenopathy.  Neurological: She is alert and oriented to person, place, and time.  Skin: Skin is warm and dry. No rash noted.  Psychiatric: She has a normal mood and affect. Her behavior is normal.          Assessment & Plan:   Viral URI.  Will treat symptomatically  Will report any fever, new or worsening symptoms

## 2015-05-26 NOTE — Patient Instructions (Signed)
Use saline irrigation, warm  moist compresses and over-the-counter decongestants only as directed.  Call if there is no improvement in 5 to 7 days, or sooner if you develop increasing pain, fever, or any new symptoms. 

## 2015-06-14 ENCOUNTER — Other Ambulatory Visit (INDEPENDENT_AMBULATORY_CARE_PROVIDER_SITE_OTHER): Payer: BLUE CROSS/BLUE SHIELD

## 2015-06-14 DIAGNOSIS — Z0000 Encounter for general adult medical examination without abnormal findings: Secondary | ICD-10-CM | POA: Diagnosis not present

## 2015-06-14 LAB — CBC WITH DIFFERENTIAL/PLATELET
BASOS PCT: 0.4 % (ref 0.0–3.0)
Basophils Absolute: 0 10*3/uL (ref 0.0–0.1)
Eosinophils Absolute: 0.1 10*3/uL (ref 0.0–0.7)
Eosinophils Relative: 1.1 % (ref 0.0–5.0)
HEMATOCRIT: 40.8 % (ref 36.0–46.0)
Hemoglobin: 13.7 g/dL (ref 12.0–15.0)
LYMPHS PCT: 25.7 % (ref 12.0–46.0)
Lymphs Abs: 1.9 10*3/uL (ref 0.7–4.0)
MCHC: 33.5 g/dL (ref 30.0–36.0)
MCV: 90.7 fl (ref 78.0–100.0)
MONOS PCT: 4.8 % (ref 3.0–12.0)
Monocytes Absolute: 0.4 10*3/uL (ref 0.1–1.0)
NEUTROS ABS: 5.1 10*3/uL (ref 1.4–7.7)
Neutrophils Relative %: 68 % (ref 43.0–77.0)
PLATELETS: 291 10*3/uL (ref 150.0–400.0)
RBC: 4.5 Mil/uL (ref 3.87–5.11)
RDW: 13.1 % (ref 11.5–15.5)
WBC: 7.6 10*3/uL (ref 4.0–10.5)

## 2015-06-14 LAB — LIPID PANEL
Cholesterol: 174 mg/dL (ref 0–200)
HDL: 41.8 mg/dL (ref >39.00)
LDL Cholesterol: 112 mg/dL — ABNORMAL HIGH (ref 0–99)
NonHDL: 131.86
TRIGLYCERIDES: 100 mg/dL (ref 0.0–149.0)
Total CHOL/HDL Ratio: 4
VLDL: 20 mg/dL (ref 0.0–40.0)

## 2015-06-14 LAB — HEPATIC FUNCTION PANEL
ALBUMIN: 3.9 g/dL (ref 3.5–5.2)
ALT: 10 U/L (ref 0–35)
AST: 12 U/L (ref 0–37)
Alkaline Phosphatase: 48 U/L (ref 39–117)
Bilirubin, Direct: 0.1 mg/dL (ref 0.0–0.3)
TOTAL PROTEIN: 6.8 g/dL (ref 6.0–8.3)
Total Bilirubin: 0.4 mg/dL (ref 0.2–1.2)

## 2015-06-14 LAB — POCT URINALYSIS DIPSTICK
Bilirubin, UA: NEGATIVE
GLUCOSE UA: NEGATIVE
Ketones, UA: NEGATIVE
NITRITE UA: NEGATIVE
Protein, UA: NEGATIVE
Spec Grav, UA: >=1.03
UROBILINOGEN UA: 0.2
pH, UA: 5.5

## 2015-06-14 LAB — BASIC METABOLIC PANEL
BUN: 12 mg/dL (ref 6–23)
CALCIUM: 8.9 mg/dL (ref 8.4–10.5)
CHLORIDE: 105 meq/L (ref 96–112)
CO2: 29 meq/L (ref 19–32)
CREATININE: 0.64 mg/dL (ref 0.40–1.20)
GFR: 111.51 mL/min (ref >60.00)
Glucose, Bld: 94 mg/dL (ref 70–99)
Potassium: 4.2 mEq/L (ref 3.5–5.1)
Sodium: 140 mEq/L (ref 135–145)

## 2015-06-14 LAB — TSH: TSH: 1.63 u[IU]/mL (ref 0.35–4.50)

## 2015-06-16 ENCOUNTER — Ambulatory Visit (INDEPENDENT_AMBULATORY_CARE_PROVIDER_SITE_OTHER): Payer: BLUE CROSS/BLUE SHIELD | Admitting: Psychology

## 2015-06-16 DIAGNOSIS — F4322 Adjustment disorder with anxiety: Secondary | ICD-10-CM

## 2015-06-19 ENCOUNTER — Encounter: Payer: Self-pay | Admitting: Internal Medicine

## 2015-06-20 ENCOUNTER — Ambulatory Visit (INDEPENDENT_AMBULATORY_CARE_PROVIDER_SITE_OTHER): Payer: BLUE CROSS/BLUE SHIELD | Admitting: Internal Medicine

## 2015-06-20 ENCOUNTER — Encounter: Payer: Self-pay | Admitting: Internal Medicine

## 2015-06-20 VITALS — BP 120/78 | HR 69 | Temp 98.3°F | Resp 20 | Ht 61.75 in | Wt 156.0 lb

## 2015-06-20 DIAGNOSIS — Z0000 Encounter for general adult medical examination without abnormal findings: Secondary | ICD-10-CM

## 2015-06-20 DIAGNOSIS — Z23 Encounter for immunization: Secondary | ICD-10-CM | POA: Diagnosis not present

## 2015-06-20 NOTE — Patient Instructions (Signed)
Health Maintenance, Female Adopting a healthy lifestyle and getting preventive care can go a long way to promote health and wellness. Talk with your health care provider about what schedule of regular examinations is right for you. This is a good chance for you to check in with your provider about disease prevention and staying healthy. In between checkups, there are plenty of things you can do on your own. Experts have done a lot of research about which lifestyle changes and preventive measures are most likely to keep you healthy. Ask your health care provider for more information. WEIGHT AND DIET  Eat a healthy diet  Be sure to include plenty of vegetables, fruits, low-fat dairy products, and lean protein.  Do not eat a lot of foods high in solid fats, added sugars, or salt.  Get regular exercise. This is one of the most important things you can do for your health.  Most adults should exercise for at least 150 minutes each week. The exercise should increase your heart rate and make you sweat (moderate-intensity exercise).  Most adults should also do strengthening exercises at least twice a week. This is in addition to the moderate-intensity exercise.  Maintain a healthy weight  Body mass index (BMI) is a measurement that can be used to identify possible weight problems. It estimates body fat based on height and weight. Your health care provider can help determine your BMI and help you achieve or maintain a healthy weight.  For females 20 years of age and older:   A BMI below 18.5 is considered underweight.  A BMI of 18.5 to 24.9 is normal.  A BMI of 25 to 29.9 is considered overweight.  A BMI of 30 and above is considered obese.  Watch levels of cholesterol and blood lipids  You should start having your blood tested for lipids and cholesterol at 37 years of age, then have this test every 5 years.  You may need to have your cholesterol levels checked more often if:  Your lipid  or cholesterol levels are high.  You are older than 37 years of age.  You are at high risk for heart disease.  CANCER SCREENING   Lung Cancer  Lung cancer screening is recommended for adults 55-80 years old who are at high risk for lung cancer because of a history of smoking.  A yearly low-dose CT scan of the lungs is recommended for people who:  Currently smoke.  Have quit within the past 15 years.  Have at least a 30-pack-year history of smoking. A pack year is smoking an average of one pack of cigarettes a day for 1 year.  Yearly screening should continue until it has been 15 years since you quit.  Yearly screening should stop if you develop a health problem that would prevent you from having lung cancer treatment.  Breast Cancer  Practice breast self-awareness. This means understanding how your breasts normally appear and feel.  It also means doing regular breast self-exams. Let your health care provider know about any changes, no matter how small.  If you are in your 20s or 30s, you should have a clinical breast exam (CBE) by a health care provider every 1-3 years as part of a regular health exam.  If you are 40 or older, have a CBE every year. Also consider having a breast X-ray (mammogram) every year.  If you have a family history of breast cancer, talk to your health care provider about genetic screening.  If you   are at high risk for breast cancer, talk to your health care provider about having an MRI and a mammogram every year.  Breast cancer gene (BRCA) assessment is recommended for women who have family members with BRCA-related cancers. BRCA-related cancers include:  Breast.  Ovarian.  Tubal.  Peritoneal cancers.  Results of the assessment will determine the need for genetic counseling and BRCA1 and BRCA2 testing. Cervical Cancer Your health care provider may recommend that you be screened regularly for cancer of the pelvic organs (ovaries, uterus, and  vagina). This screening involves a pelvic examination, including checking for microscopic changes to the surface of your cervix (Pap test). You may be encouraged to have this screening done every 3 years, beginning at age 44.  For women ages 97-65, health care providers may recommend pelvic exams and Pap testing every 3 years, or they may recommend the Pap and pelvic exam, combined with testing for human papilloma virus (HPV), every 5 years. Some types of HPV increase your risk of cervical cancer. Testing for HPV may also be done on women of any age with unclear Pap test results.  Other health care providers may not recommend any screening for nonpregnant women who are considered low risk for pelvic cancer and who do not have symptoms. Ask your health care provider if a screening pelvic exam is right for you.  If you have had past treatment for cervical cancer or a condition that could lead to cancer, you need Pap tests and screening for cancer for at least 20 years after your treatment. If Pap tests have been discontinued, your risk factors (such as having a new sexual partner) need to be reassessed to determine if screening should resume. Some women have medical problems that increase the chance of getting cervical cancer. In these cases, your health care provider may recommend more frequent screening and Pap tests. Colorectal Cancer  This type of cancer can be detected and often prevented.  Routine colorectal cancer screening usually begins at 37 years of age and continues through 37 years of age.  Your health care provider may recommend screening at an earlier age if you have risk factors for colon cancer.  Your health care provider may also recommend using home test kits to check for hidden blood in the stool.  A small camera at the end of a tube can be used to examine your colon directly (sigmoidoscopy or colonoscopy). This is done to check for the earliest forms of colorectal  cancer.  Routine screening usually begins at age 84.  Direct examination of the colon should be repeated every 5-10 years through 37 years of age. However, you may need to be screened more often if early forms of precancerous polyps or small growths are found. Skin Cancer  Check your skin from head to toe regularly.  Tell your health care provider about any new moles or changes in moles, especially if there is a change in a mole's shape or color.  Also tell your health care provider if you have a mole that is larger than the size of a pencil eraser.  Always use sunscreen. Apply sunscreen liberally and repeatedly throughout the day.  Protect yourself by wearing long sleeves, pants, a wide-brimmed hat, and sunglasses whenever you are outside. HEART DISEASE, DIABETES, AND HIGH BLOOD PRESSURE   High blood pressure causes heart disease and increases the risk of stroke. High blood pressure is more likely to develop in:  People who have blood pressure in the high end  of the normal range (130-139/85-89 mm Hg).  People who are overweight or obese.  People who are African American.  If you are 65-78 years of age, have your blood pressure checked every 3-5 years. If you are 74 years of age or older, have your blood pressure checked every year. You should have your blood pressure measured twice--once when you are at a hospital or clinic, and once when you are not at a hospital or clinic. Record the average of the two measurements. To check your blood pressure when you are not at a hospital or clinic, you can use:  An automated blood pressure machine at a pharmacy.  A home blood pressure monitor.  If you are between 72 years and 43 years old, ask your health care provider if you should take aspirin to prevent strokes.  Have regular diabetes screenings. This involves taking a blood sample to check your fasting blood sugar level.  If you are at a normal weight and have a low risk for diabetes,  have this test once every three years after 37 years of age.  If you are overweight and have a high risk for diabetes, consider being tested at a younger age or more often. PREVENTING INFECTION  Hepatitis B  If you have a higher risk for hepatitis B, you should be screened for this virus. You are considered at high risk for hepatitis B if:  You were born in a country where hepatitis B is common. Ask your health care provider which countries are considered high risk.  Your parents were born in a high-risk country, and you have not been immunized against hepatitis B (hepatitis B vaccine).  You have HIV or AIDS.  You use needles to inject street drugs.  You live with someone who has hepatitis B.  You have had sex with someone who has hepatitis B.  You get hemodialysis treatment.  You take certain medicines for conditions, including cancer, organ transplantation, and autoimmune conditions. Hepatitis C  Blood testing is recommended for:  Everyone born from [birth year] through 1967.  Anyone with known risk factors for hepatitis C. Sexually transmitted infections (STIs)  You should be screened for sexually transmitted infections (STIs) including gonorrhea and chlamydia if:  You are sexually active and are younger than 37 years of age.  You are older than 37 years of age and your health care provider tells you that you are at risk for this type of infection.  Your sexual activity has changed since you were last screened and you are at an increased risk for chlamydia or gonorrhea. Ask your health care provider if you are at risk.  If you do not have HIV, but are at risk, it may be recommended that you take a prescription medicine daily to prevent HIV infection. This is called pre-exposure prophylaxis (PrEP). You are considered at risk if:  You are sexually active and do not regularly use condoms or know the HIV status of your partner(s).  You take drugs by injection.  You are sexually  active with a partner who has HIV. Talk with your health care provider about whether you are at high risk of being infected with HIV. If you choose to begin PrEP, you should first be tested for HIV. You should then be tested every 3 months for as long as you are taking PrEP.  PREGNANCY   If you are premenopausal and you may become pregnant, ask your health care provider about preconception counseling.  If you may  become pregnant, take 400 to 800 micrograms (mcg) of folic acid every day.  If you want to prevent pregnancy, talk to your health care provider about birth control (contraception). OSTEOPOROSIS AND MENOPAUSE   Osteoporosis is a disease in which the bones lose minerals and strength with aging. This can result in serious bone fractures. Your risk for osteoporosis can be identified using a bone density scan.  If you are 26 years of age or older, or if you are at risk for osteoporosis and fractures, ask your health care provider if you should be screened.  Ask your health care provider whether you should take a calcium or vitamin D supplement to lower your risk for osteoporosis.  Menopause may have certain physical symptoms and risks.  Hormone replacement therapy may reduce some of these symptoms and risks. Talk to your health care provider about whether hormone replacement therapy is right for you.  HOME CARE INSTRUCTIONS   Schedule regular health, dental, and eye exams.  Stay current with your immunizations.   Do not use any tobacco products including cigarettes, chewing tobacco, or electronic cigarettes.  If you are pregnant, do not drink alcohol.  If you are breastfeeding, limit how much and how often you drink alcohol.  Limit alcohol intake to no more than 1 drink per day for nonpregnant women. One drink equals 12 ounces of beer, 5 ounces of wine, or 1 ounces of hard liquor.  Do not use street drugs.  Do not share needles.  Ask your health care provider for help if  you need support or information about quitting drugs.  Tell your health care provider if you often feel depressed.  Tell your health care provider if you have ever been abused or do not feel safe at home.   This information is not intended to replace advice given to you by your health care provider. Make sure you discuss any questions you have with your health care provider.   Document Released: 12/03/2010 Document Revised: 06/10/2014 Document Reviewed: 04/21/2013 Elsevier Interactive Patient Education [employer].

## 2015-06-20 NOTE — Progress Notes (Signed)
Subjective:    Patient ID: Jessica Velazquez, female    DOB: May 04, 1981, 35 y.o.   MRN: [id number]  HPI  Wt Readings from Last 3 Encounters:  06/20/15 156 lb (70.761 kg)  05/26/15 157 lb (71.215 kg)  04/04/15 158 lb (71.28 kg)   35 year old patient who is seen today for a preventive health examination.  She enjoys excellent health and takes no chronic medications.  She is scheduled for routine gynecologic examination soon.  . Past Medical History  Diagnosis Date  . No pertinent past medical history     Social History   Social History  . Marital Status: Married    Spouse Name: N/A  . Number of Children: N/A  . Years of Education: N/A   Occupational History  . Not on file.   Social History Main Topics  . Smoking status: Never Smoker   . Smokeless tobacco: Never Used  . Alcohol Use: No  . Drug Use: No  . Sexual Activity: Yes   Other Topics Concern  . Not on file   Social History Narrative    Past Surgical History  Procedure Laterality Date  . Dilation and curettage of uterus  09/2010    Family History  Problem Relation Age of Onset  . Arthritis Mother   . Hypertension Mother   . Hyperlipidemia Father   . Hypertension Father   . Diabetes Father   . Hypertension Brother     No Known Allergies  Current Outpatient Prescriptions on File Prior to Visit  Medication Sig Dispense Refill  . ORSYTHIA 0.1-20 MG-MCG tablet      No current facility-administered medications on file prior to visit.    BP 120/78 mmHg  Pulse 69  Temp(Src) 98.3 F (36.8 C) (Oral)  Resp 20  Ht 5' 1.75" (1.568 m)  Wt 156 lb (70.761 kg)  BMI 28.78 kg/m2  SpO2 98%      Review of Systems  Constitutional: Negative.   HENT: Negative for congestion, dental problem, hearing loss, rhinorrhea, sinus pressure, sore throat and tinnitus.   Eyes: Negative for pain, discharge and visual disturbance.  Respiratory: Negative for cough and shortness of breath.   Cardiovascular:  Negative for chest pain, palpitations and leg swelling.  Gastrointestinal: Negative for nausea, vomiting, abdominal pain, diarrhea, constipation, blood in stool and abdominal distention.  Genitourinary: Negative for dysuria, urgency, frequency, hematuria, flank pain, vaginal bleeding, vaginal discharge, difficulty urinating, vaginal pain and pelvic pain.  Musculoskeletal: Negative for joint swelling, arthralgias and gait problem.  Skin: Negative for rash.  Neurological: Negative for dizziness, syncope, speech difficulty, weakness, numbness and headaches.  Hematological: Negative for adenopathy.  Psychiatric/Behavioral: Negative for behavioral problems, dysphoric mood and agitation. The patient is not nervous/anxious.        Objective:   Physical Exam  Constitutional: She is oriented to person, place, and time. She appears well-developed and well-nourished.  HENT:  Head: Normocephalic.  Right Ear: External ear normal.  Left Ear: External ear normal.  Mouth/Throat: Oropharynx is clear and moist.  Eyes: Conjunctivae and EOM are normal. Pupils are equal, round, and reactive to light.  Neck: Normal range of motion. Neck supple. No thyromegaly present.  Cardiovascular: Normal rate, regular rhythm, normal heart sounds and intact distal pulses.   Pulmonary/Chest: Effort normal and breath sounds normal.  Abdominal: Soft. Bowel sounds are normal. She exhibits no mass. There is no tenderness.  Musculoskeletal: Normal range of motion.  Lymphadenopathy:    She has no cervical adenopathy.  Neurological:  She is alert and oriented to person, place, and time.  Skin: Skin is warm and dry. No rash noted.  Psychiatric: She has a normal mood and affect. Her behavior is normal.          Assessment & Plan:   Preventive health examination  Return in one year or as needed  Insurance forms completed Laboratory studies reviewed

## 2015-06-20 NOTE — Progress Notes (Signed)
Pre visit review using our clinic review tool, if applicable. No additional management support is needed unless otherwise documented below in the visit note. 

## 2015-07-26 ENCOUNTER — Telehealth: Payer: Self-pay | Admitting: Internal Medicine

## 2015-07-26 ENCOUNTER — Encounter (HOSPITAL_COMMUNITY): Payer: Self-pay

## 2015-07-26 ENCOUNTER — Emergency Department (HOSPITAL_COMMUNITY): Payer: BLUE CROSS/BLUE SHIELD

## 2015-07-26 ENCOUNTER — Emergency Department (HOSPITAL_COMMUNITY)
Admission: EM | Admit: 2015-07-26 | Discharge: 2015-07-26 | Disposition: A | Discharge status: Home / Self Care | Payer: BLUE CROSS/BLUE SHIELD | Attending: Emergency Medicine | Admitting: Emergency Medicine

## 2015-07-26 DIAGNOSIS — R42 Dizziness and giddiness: Secondary | ICD-10-CM | POA: Insufficient documentation

## 2015-07-26 DIAGNOSIS — R079 Chest pain, unspecified: Secondary | ICD-10-CM | POA: Insufficient documentation

## 2015-07-26 LAB — CBC
HCT: 42.2 % (ref 36.0–46.0)
Hemoglobin: 14.6 g/dL (ref 12.0–15.0)
MCH: 31.5 pg (ref 26.0–34.0)
MCHC: 34.6 g/dL (ref 30.0–36.0)
MCV: 90.9 fL (ref 78.0–100.0)
PLATELETS: 309 10*3/uL (ref 150–400)
RBC: 4.64 MIL/uL (ref 3.87–5.11)
RDW: 12.5 % (ref 11.5–15.5)
WBC: 9.7 10*3/uL (ref 4.0–10.5)

## 2015-07-26 LAB — I-STAT TROPONIN, ED
TROPONIN I, POC: 0 ng/mL (ref 0.00–0.08)
TROPONIN I, POC: 0 ng/mL (ref 0.00–0.08)

## 2015-07-26 LAB — BASIC METABOLIC PANEL
Anion gap: 10 (ref 5–15)
BUN: 10 mg/dL (ref 6–20)
CALCIUM: 9.2 mg/dL (ref 8.9–10.3)
CO2: 23 mmol/L (ref 22–32)
CREATININE: 0.46 mg/dL (ref 0.44–1.00)
Chloride: 107 mmol/L (ref 101–111)
GFR calc non Af Amer: >60 mL/min (ref >60)
Glucose, Bld: 101 mg/dL — ABNORMAL HIGH (ref 65–99)
Potassium: 4 mmol/L (ref 3.5–5.1)
SODIUM: 140 mmol/L (ref 135–145)

## 2015-07-26 NOTE — ED Notes (Signed)
Discharge instructions and follow up care reviewed with patient. Patient verbalized understanding. 

## 2015-07-26 NOTE — Discharge Instructions (Signed)
1. Medications: usual home medications °2. Treatment: rest, drink plenty of fluids °3. Follow Up: please followup with your primary doctor for discussion of your diagnoses and further evaluation after today's visit; please return to the ER for new or worsening symptoms ° ° °Nonspecific Chest Pain  °Chest pain can be caused by many different conditions. There is always a chance that your pain could be related to something serious, such as a heart attack or a blood clot in your lungs. Chest pain can also be caused by conditions that are not life-threatening. If you have chest pain, it is very important to follow up with your health care provider. °CAUSES  °Chest pain can be caused by: °· Heartburn. °· Pneumonia or bronchitis. °· Anxiety or stress. °· Inflammation around your heart (pericarditis) or lung (pleuritis or pleurisy). °· A blood clot in your lung. °· A collapsed lung (pneumothorax). It can develop suddenly on its own (spontaneous pneumothorax) or from trauma to the chest. °· Shingles infection (varicella-zoster virus). °· Heart attack. °· Damage to the bones, muscles, and cartilage that make up your chest wall. This can include: °¨ Bruised bones due to injury. °¨ Strained muscles or cartilage due to frequent or repeated coughing or overwork. °¨ Fracture to one or more ribs. °¨ Sore cartilage due to inflammation (costochondritis). °RISK FACTORS  °Risk factors for chest pain may include: °· Activities that increase your risk for trauma or injury to your chest. °· Respiratory infections or conditions that cause frequent coughing. °· Medical conditions or overeating that can cause heartburn. °· Heart disease or family history of heart disease. °· Conditions or health behaviors that increase your risk of developing a blood clot. °· Having had chicken pox (varicella zoster). °SIGNS AND SYMPTOMS °Chest pain can feel like: °· Burning or tingling on the surface of your chest or deep in your chest. °· Crushing,  pressure, aching, or squeezing pain. °· Dull or sharp pain that is worse when you move, cough, or take a deep breath. °· Pain that is also felt in your back, neck, shoulder, or arm, or pain that spreads to any of these areas. °Your chest pain may come and go, or it may stay constant. °DIAGNOSIS °Lab tests or other studies may be needed to find the cause of your pain. Your health care provider may have you take a test called an ambulatory ECG (electrocardiogram). An ECG records your heartbeat patterns at the time the test is performed. You may also have other tests, such as: °· Transthoracic echocardiogram (TTE). During echocardiography, sound waves are used to create a picture of all of the heart structures and to look at how blood flows through your heart. °· Transesophageal echocardiogram (TEE). This is a more advanced imaging test that obtains images from inside your body. It allows your health care provider to see your heart in finer detail. °· Cardiac monitoring. This allows your health care provider to monitor your heart rate and rhythm in real time. °· Holter monitor. This is a portable device that records your heartbeat and can help to diagnose abnormal heartbeats. It allows your health care provider to track your heart activity for several days, if needed. °· Stress tests. These can be done through exercise or by taking medicine that makes your heart beat more quickly. °· Blood tests. °· Imaging tests. °TREATMENT  °Your treatment depends on what is causing your chest pain. Treatment may include: °· Medicines. These may include: °¨ Acid blockers for heartburn. °¨ Anti-inflammatory medicine. °¨   Pain medicine for inflammatory conditions. °¨ Antibiotic medicine, if an infection is present. °¨ Medicines to dissolve blood clots. °¨ Medicines to treat coronary artery disease. °· Supportive care for conditions that do not require medicines. This may include: °¨ Resting. °¨ Applying heat or cold packs to injured  areas. °¨ Limiting activities until pain decreases. °HOME CARE INSTRUCTIONS °· If you were prescribed an antibiotic medicine, finish it all even if you start to feel better. °· Avoid any activities that bring on chest pain. °· Do not use any tobacco products, including cigarettes, chewing tobacco, or electronic cigarettes. If you need help quitting, ask your health care provider. °· Do not drink alcohol. °· Take medicines only as directed by your health care provider. °· Keep all follow-up visits as directed by your health care provider. This is important. This includes any further testing if your chest pain does not go away. °· If heartburn is the cause for your chest pain, you may be told to keep your head raised (elevated) while sleeping. This reduces the chance that acid will go from your stomach into your esophagus. °· Make lifestyle changes as directed by your health care provider. These may include: °¨ Getting regular exercise. Ask your health care provider to suggest some activities that are safe for you. °¨ Eating a heart-healthy diet. A registered dietitian can help you to learn healthy eating options. °¨ Maintaining a healthy weight. °¨ Managing diabetes, if necessary. °¨ Reducing stress. °SEEK MEDICAL CARE IF: °· Your chest pain does not go away after treatment. °· You have a rash with blisters on your chest. °· You have a fever. °SEEK IMMEDIATE MEDICAL CARE IF:  °· Your chest pain is worse. °· You have an increasing cough, or you cough up blood. °· You have severe abdominal pain. °· You have severe weakness. °· You faint. °· You have chills. °· You have sudden, unexplained chest discomfort. °· You have sudden, unexplained discomfort in your arms, back, neck, or jaw. °· You have shortness of breath at any time. °· You suddenly start to sweat, or your skin gets clammy. °· You feel nauseous or you vomit. °· You suddenly feel light-headed or dizzy. °· Your heart begins to beat quickly, or it feels like it  is skipping beats. °These symptoms may represent a serious problem that is an emergency. Do not wait to see if the symptoms will go away. Get medical help right away. Call your local emergency services (911 in the [place] Do not drive yourself to the hospital. °  °This information is not intended to replace advice given to you by your health care provider. Make sure you discuss any questions you have with your health care provider. °  °Document Released: 02/27/2005 Document Revised: 06/10/2014 Document Reviewed: 12/24/2013 °Elsevier Interactive Patient Education ©2016 [employer] ° ° °

## 2015-07-26 NOTE — ED Notes (Signed)
RN will start line and draw blood.
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
## 2015-07-26 NOTE — Telephone Encounter (Signed)
[facility] - Client TELEPHONE ADVICE RECORD [facility] Patient Name: Jessica Velazquez R DOB: 1981/03/22 Initial Comment Caller states that she has had some chest pains, chest felt tight. Nurse Assessment Nurse: Ladona Ridgel, RN, Felicia Date/Time (Eastern Time): 07/26/2015 3:44:50 PM Confirm and document reason for call. If symptomatic, describe symptoms. You must click the next button to save text entered. ---PT has chest tightness since 7:45 am - no fever. IT is coming and going - Has the patient traveled out of the country within the last 30 days? ---No Does the patient have any new or worsening symptoms? ---Yes Will a triage be completed? ---Yes Related visit to physician within the last 2 weeks? ---No Does the PT have any chronic conditions? (i.e. diabetes, asthma, etc.) ---No Is the patient pregnant or possibly pregnant? (Ask all females between the ages of 58-55) ---NoIs this a behavioral health or substance abuse call? ---No Guidelines Guideline Title Affirmed Question Affirmed Notes Chest Pain [1] Chest pain lasts > 5 minutes AND [2] described as crushing, pressure-like, or heavy Final Disposition User Call EMS 911 Now [place], RN, Sunny Schlein Disagree/Comply: Comply Call Id: [phone]

## 2015-07-26 NOTE — ED Notes (Signed)
Pt presents with c/o chest pain in the center of her chest that started around 7:45 this morning. Pt reports the pain feels like pressure and tightness in nature. Pt denies any N/V. Ambulatory to triage, no pain at this present time.

## 2015-07-27 NOTE — Telephone Encounter (Signed)
Patient went to the ER 

## 2015-09-12 DIAGNOSIS — R079 Chest pain, unspecified: Secondary | ICD-10-CM | POA: Diagnosis not present

## 2015-09-12 DIAGNOSIS — K219 Gastro-esophageal reflux disease without esophagitis: Secondary | ICD-10-CM | POA: Diagnosis not present

## 2015-09-12 DIAGNOSIS — R1013 Epigastric pain: Secondary | ICD-10-CM | POA: Diagnosis not present

## 2015-10-02 DIAGNOSIS — D485 Neoplasm of uncertain behavior of skin: Secondary | ICD-10-CM | POA: Diagnosis not present

## 2015-10-02 DIAGNOSIS — B078 Other viral warts: Secondary | ICD-10-CM | POA: Diagnosis not present

## 2015-11-29 DIAGNOSIS — L237 Allergic contact dermatitis due to plants, except food: Secondary | ICD-10-CM | POA: Diagnosis not present

## 2015-11-29 DIAGNOSIS — L299 Pruritus, unspecified: Secondary | ICD-10-CM | POA: Diagnosis not present

## 2016-01-12 ENCOUNTER — Ambulatory Visit (INDEPENDENT_AMBULATORY_CARE_PROVIDER_SITE_OTHER): Payer: BLUE CROSS/BLUE SHIELD | Admitting: Psychology

## 2016-01-15 ENCOUNTER — Ambulatory Visit (INDEPENDENT_AMBULATORY_CARE_PROVIDER_SITE_OTHER): Payer: BLUE CROSS/BLUE SHIELD | Admitting: Psychology

## 2016-01-15 DIAGNOSIS — F4322 Adjustment disorder with anxiety: Secondary | ICD-10-CM | POA: Diagnosis not present

## 2016-01-16 ENCOUNTER — Ambulatory Visit: Payer: BLUE CROSS/BLUE SHIELD | Admitting: Psychology

## 2016-02-15 ENCOUNTER — Ambulatory Visit (INDEPENDENT_AMBULATORY_CARE_PROVIDER_SITE_OTHER): Payer: BLUE CROSS/BLUE SHIELD | Admitting: Psychology

## 2016-02-15 DIAGNOSIS — F4322 Adjustment disorder with anxiety: Secondary | ICD-10-CM

## 2016-02-19 ENCOUNTER — Ambulatory Visit (INDEPENDENT_AMBULATORY_CARE_PROVIDER_SITE_OTHER): Payer: BLUE CROSS/BLUE SHIELD | Admitting: Psychology

## 2016-02-19 DIAGNOSIS — F4322 Adjustment disorder with anxiety: Secondary | ICD-10-CM | POA: Diagnosis not present

## 2016-03-19 ENCOUNTER — Ambulatory Visit (INDEPENDENT_AMBULATORY_CARE_PROVIDER_SITE_OTHER): Payer: BLUE CROSS/BLUE SHIELD | Admitting: Psychology

## 2016-03-19 DIAGNOSIS — F4322 Adjustment disorder with anxiety: Secondary | ICD-10-CM

## 2016-03-29 ENCOUNTER — Ambulatory Visit (INDEPENDENT_AMBULATORY_CARE_PROVIDER_SITE_OTHER): Payer: BLUE CROSS/BLUE SHIELD | Admitting: Psychology

## 2016-03-29 DIAGNOSIS — F4322 Adjustment disorder with anxiety: Secondary | ICD-10-CM | POA: Diagnosis not present

## 2016-04-01 ENCOUNTER — Telehealth: Payer: Self-pay | Admitting: Internal Medicine

## 2016-04-01 NOTE — Telephone Encounter (Signed)
Pt would like to know if ok to work her and her husband in for a cpe in January 2018? The first available is feb  Pt states she needs in Jan for insurance purposes.  (Advised pt ok to make cpe a year in advance next time to make sure they get in the month of Jan.)

## 2016-04-01 NOTE — Telephone Encounter (Signed)
That is fine for January.

## 2016-04-05 DIAGNOSIS — H01004 Unspecified blepharitis left upper eyelid: Secondary | ICD-10-CM | POA: Diagnosis not present

## 2016-04-05 DIAGNOSIS — H5213 Myopia, bilateral: Secondary | ICD-10-CM | POA: Diagnosis not present

## 2016-04-18 NOTE — Telephone Encounter (Signed)
Pt has been scheduled.  °

## 2016-04-24 ENCOUNTER — Ambulatory Visit: Payer: BLUE CROSS/BLUE SHIELD | Admitting: Psychology

## 2016-05-01 ENCOUNTER — Ambulatory Visit (INDEPENDENT_AMBULATORY_CARE_PROVIDER_SITE_OTHER): Payer: BLUE CROSS/BLUE SHIELD | Admitting: Psychology

## 2016-05-01 DIAGNOSIS — F4322 Adjustment disorder with anxiety: Secondary | ICD-10-CM

## 2016-05-23 ENCOUNTER — Ambulatory Visit (INDEPENDENT_AMBULATORY_CARE_PROVIDER_SITE_OTHER): Payer: BLUE CROSS/BLUE SHIELD | Admitting: Psychology

## 2016-05-23 DIAGNOSIS — F4322 Adjustment disorder with anxiety: Secondary | ICD-10-CM | POA: Diagnosis not present

## 2016-06-12 ENCOUNTER — Other Ambulatory Visit (INDEPENDENT_AMBULATORY_CARE_PROVIDER_SITE_OTHER): Payer: BLUE CROSS/BLUE SHIELD

## 2016-06-12 DIAGNOSIS — Z0000 Encounter for general adult medical examination without abnormal findings: Secondary | ICD-10-CM

## 2016-06-12 LAB — BASIC METABOLIC PANEL
BUN: 12 mg/dL (ref 6–23)
CALCIUM: 8.9 mg/dL (ref 8.4–10.5)
CO2: 27 mEq/L (ref 19–32)
Chloride: 104 mEq/L (ref 96–112)
Creatinine, Ser: 0.61 mg/dL (ref 0.40–1.20)
GFR: 117.21 mL/min (ref >60.00)
GLUCOSE: 88 mg/dL (ref 70–99)
POTASSIUM: 4.1 meq/L (ref 3.5–5.1)
Sodium: 139 mEq/L (ref 135–145)

## 2016-06-12 LAB — CBC WITH DIFFERENTIAL/PLATELET
Basophils Absolute: 0 10*3/uL (ref 0.0–0.1)
Basophils Relative: 0.4 % (ref 0.0–3.0)
EOS PCT: 1.2 % (ref 0.0–5.0)
Eosinophils Absolute: 0.1 10*3/uL (ref 0.0–0.7)
HCT: 39 % (ref 36.0–46.0)
Hemoglobin: 13.6 g/dL (ref 12.0–15.0)
LYMPHS ABS: 1.9 10*3/uL (ref 0.7–4.0)
Lymphocytes Relative: 27 % (ref 12.0–46.0)
MCHC: 34.8 g/dL (ref 30.0–36.0)
MCV: 89 fl (ref 78.0–100.0)
MONO ABS: 0.3 10*3/uL (ref 0.1–1.0)
Monocytes Relative: 4.8 % (ref 3.0–12.0)
NEUTROS PCT: 66.6 % (ref 43.0–77.0)
Neutro Abs: 4.6 10*3/uL (ref 1.4–7.7)
Platelets: 313 10*3/uL (ref 150.0–400.0)
RBC: 4.39 Mil/uL (ref 3.87–5.11)
RDW: 13 % (ref 11.5–15.5)
WBC: 7 10*3/uL (ref 4.0–10.5)

## 2016-06-12 LAB — POC URINALSYSI DIPSTICK (AUTOMATED)
BILIRUBIN UA: NEGATIVE
GLUCOSE UA: NEGATIVE
KETONES UA: NEGATIVE
LEUKOCYTES UA: NEGATIVE
Nitrite, UA: NEGATIVE
Spec Grav, UA: 1.025
Urobilinogen, UA: 0.2
pH, UA: 5.5

## 2016-06-12 LAB — HEPATIC FUNCTION PANEL
ALT: 11 U/L (ref 0–35)
AST: 11 U/L (ref 0–37)
Albumin: 3.9 g/dL (ref 3.5–5.2)
Alkaline Phosphatase: 50 U/L (ref 39–117)
BILIRUBIN TOTAL: 0.4 mg/dL (ref 0.2–1.2)
Bilirubin, Direct: 0.1 mg/dL (ref 0.0–0.3)
Total Protein: 7 g/dL (ref 6.0–8.3)

## 2016-06-12 LAB — LIPID PANEL
CHOLESTEROL: 176 mg/dL (ref 0–200)
HDL: 46.8 mg/dL (ref >39.00)
LDL CALC: 111 mg/dL — AB (ref 0–99)
NonHDL: 129.15
Total CHOL/HDL Ratio: 4
Triglycerides: 90 mg/dL (ref 0.0–149.0)
VLDL: 18 mg/dL (ref 0.0–40.0)

## 2016-06-12 LAB — TSH: TSH: 1.58 u[IU]/mL (ref 0.35–4.50)

## 2016-06-19 ENCOUNTER — Encounter: Payer: Self-pay | Admitting: Internal Medicine

## 2016-06-24 ENCOUNTER — Ambulatory Visit (INDEPENDENT_AMBULATORY_CARE_PROVIDER_SITE_OTHER): Payer: BLUE CROSS/BLUE SHIELD | Admitting: Psychology

## 2016-06-24 DIAGNOSIS — F4322 Adjustment disorder with anxiety: Secondary | ICD-10-CM | POA: Diagnosis not present

## 2016-06-28 ENCOUNTER — Ambulatory Visit (INDEPENDENT_AMBULATORY_CARE_PROVIDER_SITE_OTHER): Payer: BLUE CROSS/BLUE SHIELD | Admitting: Internal Medicine

## 2016-06-28 ENCOUNTER — Encounter: Payer: Self-pay | Admitting: Internal Medicine

## 2016-06-28 VITALS — BP 118/68 | HR 68 | Temp 98.4°F | Ht 62.5 in | Wt 159.0 lb

## 2016-06-28 DIAGNOSIS — Z0000 Encounter for general adult medical examination without abnormal findings: Secondary | ICD-10-CM | POA: Diagnosis not present
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
## 2016-06-28 NOTE — Patient Instructions (Addendum)

## 2016-06-28 NOTE — Progress Notes (Signed)
Pre visit review using our clinic review tool, if applicable. No additional management support is needed unless otherwise documented below in the visit note. 

## 2016-07-05 ENCOUNTER — Ambulatory Visit (INDEPENDENT_AMBULATORY_CARE_PROVIDER_SITE_OTHER): Payer: BLUE CROSS/BLUE SHIELD | Admitting: Psychology

## 2016-07-05 DIAGNOSIS — F4322 Adjustment disorder with anxiety: Secondary | ICD-10-CM | POA: Diagnosis not present

## 2016-07-08 ENCOUNTER — Encounter: Payer: Self-pay | Admitting: Internal Medicine

## 2016-07-08 ENCOUNTER — Ambulatory Visit (INDEPENDENT_AMBULATORY_CARE_PROVIDER_SITE_OTHER): Payer: BLUE CROSS/BLUE SHIELD | Admitting: Psychology

## 2016-07-08 DIAGNOSIS — F4322 Adjustment disorder with anxiety: Secondary | ICD-10-CM

## 2016-07-09 ENCOUNTER — Ambulatory Visit (INDEPENDENT_AMBULATORY_CARE_PROVIDER_SITE_OTHER): Payer: BLUE CROSS/BLUE SHIELD | Admitting: Psychology

## 2016-07-09 DIAGNOSIS — F4322 Adjustment disorder with anxiety: Secondary | ICD-10-CM

## 2016-07-15 ENCOUNTER — Ambulatory Visit (INDEPENDENT_AMBULATORY_CARE_PROVIDER_SITE_OTHER): Payer: BLUE CROSS/BLUE SHIELD | Admitting: Psychology

## 2016-07-15 DIAGNOSIS — F4322 Adjustment disorder with anxiety: Secondary | ICD-10-CM | POA: Diagnosis not present

## 2016-08-14 ENCOUNTER — Ambulatory Visit (INDEPENDENT_AMBULATORY_CARE_PROVIDER_SITE_OTHER): Payer: BLUE CROSS/BLUE SHIELD | Admitting: Family Medicine

## 2016-08-14 ENCOUNTER — Encounter: Payer: Self-pay | Admitting: Family Medicine

## 2016-08-14 VITALS — BP 106/82 | HR 70 | Temp 98.3°F | Ht 62.5 in | Wt 159.0 lb

## 2016-08-14 DIAGNOSIS — J069 Acute upper respiratory infection, unspecified: Secondary | ICD-10-CM | POA: Diagnosis not present

## 2016-08-14 DIAGNOSIS — B9789 Other viral agents as the cause of diseases classified elsewhere: Secondary | ICD-10-CM

## 2016-08-14 NOTE — Progress Notes (Signed)
Pre visit review using our clinic review tool, if applicable. No additional management support is needed unless otherwise documented below in the visit note. 

## 2016-08-14 NOTE — Progress Notes (Signed)
   Subjective:    Patient ID: Jessica Velazquez, female    DOB: 1980/10/02, 36 y.o.   MRN: [id number]  HPI Here for one week of intermittent mild symptoms like ST, stuffy head, and a dry cough. No fever. Her young children are currently being treated for sinus infections and ear infections.    Review of Systems  Constitutional: Negative.   HENT: Positive for congestion, postnasal drip and sore throat. Negative for ear pain, sinus pain and sinus pressure.   Eyes: Negative.   Respiratory: Negative.   Gastrointestinal: Negative.        Objective:   Physical Exam  Constitutional: She appears well-developed and well-nourished. No distress.  HENT:  Right Ear: External ear normal.  Left Ear: External ear normal.  Nose: Nose normal.  Mouth/Throat: Oropharynx is clear and moist.  Eyes: Conjunctivae are normal.  Neck: No thyromegaly present.  Pulmonary/Chest: Effort normal and breath sounds normal.  Lymphadenopathy:    She has no cervical adenopathy.          Assessment & Plan:  Viral URI. Rest, drink fluids. Recheck prn.  Gershon CraneStephen Fry, MD

## 2016-08-14 NOTE — Patient Instructions (Signed)
WE NOW OFFER   [facility] FAST TRACK!!!  SAME DAY Appointments for ACUTE CARE  Such as: Sprains, Injuries, cuts, abrasions, rashes, muscle pain, joint pain, back pain Colds, flu, sore throats, headache, allergies, cough, fever  Ear pain, sinus and eye infections Abdominal pain, nausea, vomiting, diarrhea, upset stomach Animal/insect bites  3 Easy Ways to Schedule: Walk-In Scheduling Call in scheduling Mychart Sign-up: [email or web address]         

## 2016-08-16 DIAGNOSIS — Z1389 Encounter for screening for other disorder: Secondary | ICD-10-CM | POA: Diagnosis not present

## 2016-08-16 DIAGNOSIS — Z3041 Encounter for surveillance of contraceptive pills: Secondary | ICD-10-CM | POA: Diagnosis not present

## 2016-08-16 DIAGNOSIS — Z124 Encounter for screening for malignant neoplasm of cervix: Secondary | ICD-10-CM | POA: Diagnosis not present

## 2016-08-16 DIAGNOSIS — Z130 Encounter for screening for diseases of the blood and blood-forming organs and certain disorders involving the immune mechanism: Secondary | ICD-10-CM | POA: Diagnosis not present

## 2016-08-16 DIAGNOSIS — Z01419 Encounter for gynecological examination (general) (routine) without abnormal findings: Secondary | ICD-10-CM | POA: Diagnosis not present

## 2016-10-02 ENCOUNTER — Encounter: Payer: Self-pay | Admitting: Internal Medicine

## 2016-10-02 ENCOUNTER — Ambulatory Visit (INDEPENDENT_AMBULATORY_CARE_PROVIDER_SITE_OTHER): Payer: BLUE CROSS/BLUE SHIELD | Admitting: Internal Medicine

## 2016-10-02 ENCOUNTER — Ambulatory Visit: Payer: BLUE CROSS/BLUE SHIELD | Admitting: Internal Medicine

## 2016-10-02 VITALS — BP 102/78 | HR 78 | Temp 98.5°F | Ht 62.5 in | Wt 160.2 lb

## 2016-10-02 DIAGNOSIS — J069 Acute upper respiratory infection, unspecified: Secondary | ICD-10-CM | POA: Diagnosis not present

## 2016-10-02 NOTE — Progress Notes (Signed)
Subjective:    Patient ID: Jessica Velazquez, female    DOB: 06/20/1980, 36 y.o.   MRN: [id number]  HPI  36 year old patient who presents with a several-day history of hoarseness, nonproductive cough.  In general sense of unwellness.  She became ill while on a business trip to the [facility].  Her hoarseness has steadily improved  Past Medical History:  Diagnosis Date  . No pertinent past medical history      Social History   Social History  . Marital status: Married    Spouse name: N/A  . Number of children: N/A  . Years of education: N/A   Occupational History  . Not on file.   Social History Main Topics  . Smoking status: Never Smoker  . Smokeless tobacco: Never Used  . Alcohol use No  . Drug use: No  . Sexual activity: Yes   Other Topics Concern  . Not on file   Social History Narrative  . No narrative on file    Past Surgical History:  Procedure Laterality Date  . DILATION AND CURETTAGE OF UTERUS  09/2010    Family History  Problem Relation Age of Onset  . Arthritis Mother   . Hypertension Mother   . Hyperlipidemia Father   . Hypertension Father   . Diabetes Father   . Hypertension Brother     No Known Allergies  Current Outpatient Prescriptions on File Prior to Visit  Medication Sig Dispense Refill  . ORSYTHIA 0.1-20 MG-MCG tablet      No current facility-administered medications on file prior to visit.     BP 102/78 (BP Location: Left Arm, Patient Position: Sitting, Cuff Size: Normal)   Pulse 78   Temp 98.5 F (36.9 C) (Oral)   Ht 5' 2.5" (1.588 m)   Wt 160 lb 3.2 oz (72.7 kg)   SpO2 97%   BMI 28.83 kg/m     Review of Systems  Constitutional: Positive for activity change, appetite change and fatigue. Negative for fever.  HENT: Positive for voice change. Negative for congestion, dental problem, hearing loss, rhinorrhea, sinus pressure, sore throat and tinnitus.   Eyes: Negative for pain, discharge and visual disturbance.    Respiratory: Positive for choking. Negative for cough and shortness of breath.   Cardiovascular: Negative for chest pain, palpitations and leg swelling.  Gastrointestinal: Negative for abdominal distention, abdominal pain, blood in stool, constipation, diarrhea, nausea and vomiting.  Genitourinary: Negative for difficulty urinating, dysuria, flank pain, frequency, hematuria, pelvic pain, urgency, vaginal bleeding, vaginal discharge and vaginal pain.  Musculoskeletal: Negative for arthralgias, gait problem and joint swelling.  Skin: Negative for rash.  Neurological: Negative for dizziness, syncope, speech difficulty, weakness, numbness and headaches.  Hematological: Negative for adenopathy.  Psychiatric/Behavioral: Negative for agitation, behavioral problems and dysphoric mood. The patient is not nervous/anxious.        Objective:   Physical Exam  Constitutional: She is oriented to person, place, and time. She appears well-developed and well-nourished.  HENT:  Head: Normocephalic.  Right Ear: External ear normal.  Left Ear: External ear normal.  Mouth/Throat: Oropharynx is clear and moist.  Prominent tonsils Pharyngeal crowding  Eyes: Conjunctivae and EOM are normal. Pupils are equal, round, and reactive to light.  Neck: Normal range of motion. Neck supple. No thyromegaly present.  Cardiovascular: Normal rate, regular rhythm, normal heart sounds and intact distal pulses.   Pulmonary/Chest: Effort normal and breath sounds normal.  Abdominal: Soft. Bowel sounds are normal. She exhibits no mass.  There is no tenderness.  Musculoskeletal: Normal range of motion.  Lymphadenopathy:    She has no cervical adenopathy.  Neurological: She is alert and oriented to person, place, and time.  Skin: Skin is warm and dry. No rash noted.  Psychiatric: She has a normal mood and affect. Her behavior is normal.          Assessment & Plan:   Viral URI with hoarseness and cough.  Will continue  symptomatic treatment  Rogelia Boga

## 2016-10-02 NOTE — Progress Notes (Signed)
Pre visit review using our clinic review tool, if applicable. No additional management support is needed unless otherwise documented below in the visit note. 

## 2016-10-02 NOTE — Patient Instructions (Signed)
Hydrate and Humidify  Drink enough water to keep your urine clear or pale yellow. Staying hydrated will help to thin your mucus.  Use a cool mist humidifier to keep the humidity level in your home above 50%.  Inhale steam for 10-15 minutes, 3-4 times a day or as told by your health care provider. You can do this in the bathroom while a hot shower is running.  Limit your exposure to cool or dry air. Rest  Rest as much as possible.  

## 2016-11-28 ENCOUNTER — Ambulatory Visit (INDEPENDENT_AMBULATORY_CARE_PROVIDER_SITE_OTHER): Payer: BLUE CROSS/BLUE SHIELD | Admitting: Psychology

## 2016-11-28 DIAGNOSIS — F4322 Adjustment disorder with anxiety: Secondary | ICD-10-CM

## 2016-12-09 ENCOUNTER — Ambulatory Visit (INDEPENDENT_AMBULATORY_CARE_PROVIDER_SITE_OTHER): Payer: BLUE CROSS/BLUE SHIELD | Admitting: Psychology

## 2016-12-09 DIAGNOSIS — F4322 Adjustment disorder with anxiety: Secondary | ICD-10-CM

## 2016-12-10 ENCOUNTER — Encounter: Payer: Self-pay | Admitting: Internal Medicine

## 2016-12-10 ENCOUNTER — Ambulatory Visit (INDEPENDENT_AMBULATORY_CARE_PROVIDER_SITE_OTHER): Payer: BLUE CROSS/BLUE SHIELD | Admitting: Internal Medicine

## 2016-12-10 VITALS — BP 102/58 | HR 65 | Temp 98.5°F | Ht 62.5 in | Wt 159.0 lb

## 2016-12-10 DIAGNOSIS — S81802A Unspecified open wound, left lower leg, initial encounter: Secondary | ICD-10-CM | POA: Diagnosis not present

## 2016-12-10 NOTE — Patient Instructions (Addendum)
WE NOW OFFER   Jessica [facility] FAST TRACK!!!  SAME DAY Appointments for ACUTE CARE  Such as: Sprains, Injuries, cuts, abrasions, rashes, muscle pain, joint pain, back pain Colds, flu, sore throats, headache, allergies, cough, fever  Ear pain, sinus and eye infections Abdominal pain, nausea, vomiting, diarrhea, upset stomach Animal/insect bites  3 Easy Ways to Schedule: Walk-In Scheduling Call in scheduling Mychart Sign-up: [email or web address]   Call or return to clinic prn if these symptoms worsen or fail to improve as anticipated.       
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
## 2016-12-12 ENCOUNTER — Ambulatory Visit (INDEPENDENT_AMBULATORY_CARE_PROVIDER_SITE_OTHER): Payer: BLUE CROSS/BLUE SHIELD | Admitting: Psychology

## 2016-12-12 DIAGNOSIS — F4322 Adjustment disorder with anxiety: Secondary | ICD-10-CM | POA: Diagnosis not present

## 2016-12-16 ENCOUNTER — Ambulatory Visit (INDEPENDENT_AMBULATORY_CARE_PROVIDER_SITE_OTHER): Payer: BLUE CROSS/BLUE SHIELD | Admitting: Psychology

## 2016-12-16 DIAGNOSIS — F4322 Adjustment disorder with anxiety: Secondary | ICD-10-CM | POA: Diagnosis not present

## 2016-12-20 ENCOUNTER — Ambulatory Visit (INDEPENDENT_AMBULATORY_CARE_PROVIDER_SITE_OTHER): Payer: BLUE CROSS/BLUE SHIELD | Admitting: Psychology

## 2016-12-20 DIAGNOSIS — F4322 Adjustment disorder with anxiety: Secondary | ICD-10-CM | POA: Diagnosis not present

## 2016-12-30 ENCOUNTER — Ambulatory Visit (INDEPENDENT_AMBULATORY_CARE_PROVIDER_SITE_OTHER): Payer: BLUE CROSS/BLUE SHIELD | Admitting: Psychology

## 2016-12-30 DIAGNOSIS — F4322 Adjustment disorder with anxiety: Secondary | ICD-10-CM | POA: Diagnosis not present

## 2017-01-07 ENCOUNTER — Ambulatory Visit (INDEPENDENT_AMBULATORY_CARE_PROVIDER_SITE_OTHER): Payer: BLUE CROSS/BLUE SHIELD | Admitting: Psychology

## 2017-01-07 DIAGNOSIS — F4322 Adjustment disorder with anxiety: Secondary | ICD-10-CM

## 2017-01-15 ENCOUNTER — Ambulatory Visit (INDEPENDENT_AMBULATORY_CARE_PROVIDER_SITE_OTHER): Payer: BLUE CROSS/BLUE SHIELD | Admitting: Psychology

## 2017-01-15 DIAGNOSIS — F4322 Adjustment disorder with anxiety: Secondary | ICD-10-CM

## 2017-01-21 ENCOUNTER — Ambulatory Visit (INDEPENDENT_AMBULATORY_CARE_PROVIDER_SITE_OTHER): Payer: BLUE CROSS/BLUE SHIELD | Admitting: Psychology

## 2017-01-21 DIAGNOSIS — F4322 Adjustment disorder with anxiety: Secondary | ICD-10-CM | POA: Diagnosis not present

## 2017-01-22 ENCOUNTER — Ambulatory Visit (INDEPENDENT_AMBULATORY_CARE_PROVIDER_SITE_OTHER): Payer: BLUE CROSS/BLUE SHIELD | Admitting: Psychology

## 2017-01-22 DIAGNOSIS — F4322 Adjustment disorder with anxiety: Secondary | ICD-10-CM

## 2017-02-18 ENCOUNTER — Ambulatory Visit (INDEPENDENT_AMBULATORY_CARE_PROVIDER_SITE_OTHER): Payer: BLUE CROSS/BLUE SHIELD | Admitting: Psychology

## 2017-02-18 DIAGNOSIS — F4323 Adjustment disorder with mixed anxiety and depressed mood: Secondary | ICD-10-CM | POA: Diagnosis not present

## 2017-02-25 ENCOUNTER — Ambulatory Visit (INDEPENDENT_AMBULATORY_CARE_PROVIDER_SITE_OTHER): Payer: BLUE CROSS/BLUE SHIELD | Admitting: Psychology

## 2017-02-25 DIAGNOSIS — F4322 Adjustment disorder with anxiety: Secondary | ICD-10-CM | POA: Diagnosis not present

## 2017-03-07 ENCOUNTER — Ambulatory Visit (INDEPENDENT_AMBULATORY_CARE_PROVIDER_SITE_OTHER): Payer: BLUE CROSS/BLUE SHIELD | Admitting: Psychology

## 2017-03-07 DIAGNOSIS — F4323 Adjustment disorder with mixed anxiety and depressed mood: Secondary | ICD-10-CM | POA: Diagnosis not present

## 2017-03-14 ENCOUNTER — Ambulatory Visit (INDEPENDENT_AMBULATORY_CARE_PROVIDER_SITE_OTHER): Payer: BLUE CROSS/BLUE SHIELD | Admitting: Psychology

## 2017-03-14 DIAGNOSIS — F4323 Adjustment disorder with mixed anxiety and depressed mood: Secondary | ICD-10-CM | POA: Diagnosis not present

## 2017-03-21 ENCOUNTER — Ambulatory Visit (INDEPENDENT_AMBULATORY_CARE_PROVIDER_SITE_OTHER): Payer: BLUE CROSS/BLUE SHIELD | Admitting: Psychology

## 2017-03-21 DIAGNOSIS — F4323 Adjustment disorder with mixed anxiety and depressed mood: Secondary | ICD-10-CM | POA: Diagnosis not present

## 2017-03-28 ENCOUNTER — Ambulatory Visit (INDEPENDENT_AMBULATORY_CARE_PROVIDER_SITE_OTHER): Payer: BLUE CROSS/BLUE SHIELD | Admitting: Psychology

## 2017-03-28 ENCOUNTER — Ambulatory Visit: Payer: Self-pay | Admitting: Psychology

## 2017-03-28 DIAGNOSIS — F4323 Adjustment disorder with mixed anxiety and depressed mood: Secondary | ICD-10-CM | POA: Diagnosis not present

## 2017-04-04 ENCOUNTER — Ambulatory Visit (INDEPENDENT_AMBULATORY_CARE_PROVIDER_SITE_OTHER): Payer: BLUE CROSS/BLUE SHIELD | Admitting: Psychology

## 2017-04-04 DIAGNOSIS — F4323 Adjustment disorder with mixed anxiety and depressed mood: Secondary | ICD-10-CM | POA: Diagnosis not present

## 2017-04-09 DIAGNOSIS — H5213 Myopia, bilateral: Secondary | ICD-10-CM | POA: Diagnosis not present

## 2017-04-09 DIAGNOSIS — H04123 Dry eye syndrome of bilateral lacrimal glands: Secondary | ICD-10-CM | POA: Diagnosis not present

## 2017-04-09 DIAGNOSIS — H52203 Unspecified astigmatism, bilateral: Secondary | ICD-10-CM | POA: Diagnosis not present

## 2017-04-11 ENCOUNTER — Ambulatory Visit: Payer: Self-pay | Admitting: Psychology

## 2017-04-18 ENCOUNTER — Ambulatory Visit (INDEPENDENT_AMBULATORY_CARE_PROVIDER_SITE_OTHER): Payer: BLUE CROSS/BLUE SHIELD | Admitting: Psychology

## 2017-04-18 DIAGNOSIS — F4323 Adjustment disorder with mixed anxiety and depressed mood: Secondary | ICD-10-CM | POA: Diagnosis not present

## 2017-04-19 ENCOUNTER — Ambulatory Visit: Payer: BLUE CROSS/BLUE SHIELD | Admitting: Family Medicine

## 2017-04-19 ENCOUNTER — Encounter: Payer: Self-pay | Admitting: Family Medicine

## 2017-04-19 VITALS — BP 118/70 | HR 61 | Temp 98.6°F | Ht 62.5 in | Wt 160.8 lb

## 2017-04-19 DIAGNOSIS — J018 Other acute sinusitis: Secondary | ICD-10-CM

## 2017-04-19 MED ORDER — AZITHROMYCIN 250 MG PO TABS: ORAL_TABLET | ORAL | 0 refills | Status: DC

## 2017-04-19 NOTE — Progress Notes (Signed)
   Subjective:    Patient ID: Jessica Velazquez, female    DOB: [birth year], 36 y.o.   MRN: [id number]     Review of Systems  Respiratory: Positive for cough.        Objective:   Physical Exam        Assessment & Plan:

## 2017-04-19 NOTE — Progress Notes (Signed)
   Subjective:    Patient ID: Jessica Velazquez, female    DOB: 03/18/1981, 36 y.o.   MRN: [id number]  Cough  Associated symptoms include postnasal drip. Pertinent negatives include no ear pain or sore throat.  Here for 2 weeks of stuffy head, PND, and coughing up green sputum.     Review of Systems  Constitutional: Negative.   HENT: Positive for congestion, postnasal drip, sinus pressure and sinus pain. Negative for ear pain and sore throat.   Eyes: Negative.   Respiratory: Positive for cough.        Objective:   Physical Exam  Constitutional: She appears well-developed and well-nourished.  HENT:  Right Ear: External ear normal.  Left Ear: External ear normal.  Nose: Nose normal.  Mouth/Throat: Oropharynx is clear and moist.  Eyes: Conjunctivae are normal.  Neck: No thyromegaly present.  Pulmonary/Chest: Effort normal and breath sounds normal. No respiratory distress. She has no wheezes. She has no rales.  Lymphadenopathy:    She has no cervical adenopathy.          Assessment & Plan:  Sinusitis, treat with a Zpack. Add Mucinex prn.  Gershon CraneStephen Fry, MD

## 2017-04-25 ENCOUNTER — Ambulatory Visit: Payer: Self-pay | Admitting: Psychology

## 2017-05-02 ENCOUNTER — Ambulatory Visit: Payer: Self-pay | Admitting: Psychology

## 2017-05-09 ENCOUNTER — Ambulatory Visit (INDEPENDENT_AMBULATORY_CARE_PROVIDER_SITE_OTHER): Payer: BLUE CROSS/BLUE SHIELD | Admitting: Psychology

## 2017-05-09 ENCOUNTER — Ambulatory Visit: Payer: BLUE CROSS/BLUE SHIELD | Admitting: Psychology

## 2017-05-09 DIAGNOSIS — F4323 Adjustment disorder with mixed anxiety and depressed mood: Secondary | ICD-10-CM | POA: Diagnosis not present

## 2017-05-16 ENCOUNTER — Ambulatory Visit (INDEPENDENT_AMBULATORY_CARE_PROVIDER_SITE_OTHER): Payer: BLUE CROSS/BLUE SHIELD | Admitting: Psychology

## 2017-05-16 ENCOUNTER — Ambulatory Visit: Payer: Self-pay | Admitting: Psychology

## 2017-05-16 DIAGNOSIS — F4323 Adjustment disorder with mixed anxiety and depressed mood: Secondary | ICD-10-CM | POA: Diagnosis not present

## 2017-05-23 ENCOUNTER — Ambulatory Visit: Payer: Self-pay | Admitting: Psychology

## 2017-05-30 ENCOUNTER — Ambulatory Visit: Payer: Self-pay | Admitting: Psychology

## 2017-06-06 ENCOUNTER — Ambulatory Visit: Payer: BLUE CROSS/BLUE SHIELD | Admitting: Psychology

## 2017-06-06 DIAGNOSIS — F4323 Adjustment disorder with mixed anxiety and depressed mood: Secondary | ICD-10-CM | POA: Diagnosis not present

## 2017-06-13 ENCOUNTER — Ambulatory Visit: Payer: Self-pay | Admitting: Psychology

## 2017-06-20 ENCOUNTER — Ambulatory Visit: Payer: Self-pay | Admitting: Psychology

## 2017-06-27 ENCOUNTER — Ambulatory Visit: Payer: Self-pay | Admitting: Psychology

## 2017-07-01 ENCOUNTER — Encounter: Payer: Self-pay | Admitting: Internal Medicine

## 2017-07-04 ENCOUNTER — Ambulatory Visit: Payer: Self-pay | Admitting: Psychology

## 2017-07-11 ENCOUNTER — Ambulatory Visit: Payer: Self-pay | Admitting: Psychology

## 2017-07-14 ENCOUNTER — Encounter: Payer: Self-pay | Admitting: Internal Medicine

## 2017-07-14 ENCOUNTER — Ambulatory Visit (INDEPENDENT_AMBULATORY_CARE_PROVIDER_SITE_OTHER): Payer: BLUE CROSS/BLUE SHIELD | Admitting: Internal Medicine

## 2017-07-14 VITALS — BP 102/70 | HR 74 | Temp 98.3°F | Ht 62.5 in | Wt 158.4 lb

## 2017-07-14 DIAGNOSIS — Z0000 Encounter for general adult medical examination without abnormal findings: Secondary | ICD-10-CM | POA: Diagnosis not present

## 2017-07-14 LAB — CBC WITH DIFFERENTIAL/PLATELET
BASOS PCT: 0.5 %
Basophils Absolute: 38 cells/uL (ref 0–200)
Eosinophils Absolute: 60 cells/uL (ref 15–500)
Eosinophils Relative: 0.8 %
HCT: 40.6 % (ref 35.0–45.0)
Hemoglobin: 13.9 g/dL (ref 11.7–15.5)
Lymphs Abs: 1995 cells/uL (ref 850–3900)
MCH: 31.2 pg (ref 27.0–33.0)
MCHC: 34.2 g/dL (ref 32.0–36.0)
MCV: 91 fL (ref 80.0–100.0)
MPV: 9.9 fL (ref 7.5–12.5)
Monocytes Relative: 4.4 %
Neutro Abs: 5078 cells/uL (ref 1500–7800)
Neutrophils Relative %: 67.7 %
PLATELETS: 293 10*3/uL (ref 140–400)
RBC: 4.46 10*6/uL (ref 3.80–5.10)
RDW: 12.5 % (ref 11.0–15.0)
TOTAL LYMPHOCYTE: 26.6 %
WBC mixed population: 330 cells/uL (ref 200–950)
WBC: 7.5 10*3/uL (ref 3.8–10.8)

## 2017-07-14 LAB — COMPREHENSIVE METABOLIC PANEL
ALBUMIN: 3.9 g/dL (ref 3.5–5.2)
ALT: 12 U/L (ref 0–35)
AST: 11 U/L (ref 0–37)
Alkaline Phosphatase: 50 U/L (ref 39–117)
BILIRUBIN TOTAL: 0.5 mg/dL (ref 0.2–1.2)
BUN: 14 mg/dL (ref 6–23)
CALCIUM: 8.7 mg/dL (ref 8.4–10.5)
CO2: 27 mEq/L (ref 19–32)
CREATININE: 0.61 mg/dL (ref 0.40–1.20)
Chloride: 103 mEq/L (ref 96–112)
GFR: 116.53 mL/min (ref >60.00)
Glucose, Bld: 89 mg/dL (ref 70–99)
Potassium: 4.1 mEq/L (ref 3.5–5.1)
Sodium: 138 mEq/L (ref 135–145)
Total Protein: 7.1 g/dL (ref 6.0–8.3)

## 2017-07-14 LAB — LIPID PANEL
Cholesterol: 181 mg/dL (ref 0–200)
HDL: 42 mg/dL (ref >39.00)
LDL Cholesterol: 118 mg/dL — ABNORMAL HIGH (ref 0–99)
NonHDL: 138.84
TRIGLYCERIDES: 102 mg/dL (ref 0.0–149.0)
Total CHOL/HDL Ratio: 4
VLDL: 20.4 mg/dL (ref 0.0–40.0)

## 2017-07-14 LAB — TSH: TSH: 1.4 u[IU]/mL (ref 0.35–4.50)

## 2017-07-14 NOTE — Patient Instructions (Addendum)
It is important that you exercise regularly, at least 20 minutes 3 to 4 times per week.  If you develop chest pain or shortness of breath seek  medical attention.  You need to lose weight.  Consider a lower calorie diet and regular exercise.  Health Maintenance, Female Adopting a healthy lifestyle and getting preventive care can go a long way to promote health and wellness. Talk with your health care provider about what schedule of regular examinations is right for you. This is a good chance for you to check in with your provider about disease prevention and staying healthy. In between checkups, there are plenty of things you can do on your own. Experts have done a lot of research about which lifestyle changes and preventive measures are most likely to keep you healthy. Ask your health care provider for more information. Weight and diet Eat a healthy diet  Be sure to include plenty of vegetables, fruits, low-fat dairy products, and lean protein.  Do not eat a lot of foods high in solid fats, added sugars, or salt.  Get regular exercise. This is one of the most important things you can do for your health. ? Most adults should exercise for at least 150 minutes each week. The exercise should increase your heart rate and make you sweat (moderate-intensity exercise). ? Most adults should also do strengthening exercises at least twice a week. This is in addition to the moderate-intensity exercise.  Maintain a healthy weight  Body mass index (BMI) is a measurement that can be used to identify possible weight problems. It estimates body fat based on height and weight. Your health care provider can help determine your BMI and help you achieve or maintain a healthy weight.  For females 89 years of age and older: ? A BMI below 18.5 is considered underweight. ? A BMI of 18.5 to 24.9 is normal. ? A BMI of 25 to 29.9 is considered overweight. ? A BMI of 30 and above is considered obese.  Watch levels  of cholesterol and blood lipids  You should start having your blood tested for lipids and cholesterol at 39 years of age, then have this test every 5 years.  You may need to have your cholesterol levels checked more often if: ? Your lipid or cholesterol levels are high. ? You are older than 39 years of age. ? You are at high risk for heart disease.  Cancer screening Lung Cancer  Lung cancer screening is recommended for adults 68-101 years old who are at high risk for lung cancer because of a history of smoking.  A yearly low-dose CT scan of the lungs is recommended for people who: ? Currently smoke. ? Have quit within the past 15 years. ? Have at least a 30-pack-year history of smoking. A pack year is smoking an average of one pack of cigarettes a day for 1 year.  Yearly screening should continue until it has been 15 years since you quit.  Yearly screening should stop if you develop a health problem that would prevent you from having lung cancer treatment.  Breast Cancer  Practice breast self-awareness. This means understanding how your breasts normally appear and feel.  It also means doing regular breast self-exams. Let your health care provider know about any changes, no matter how small.  If you are in your 20s or 30s, you should have a clinical breast exam (CBE) by a health care provider every 1-3 years as part of a regular health exam.  If you are 40 or older, have a CBE every year. Also consider having a breast X-ray (mammogram) every year.  If you have a family history of breast cancer, talk to your health care provider about genetic screening.  If you are at high risk for breast cancer, talk to your health care provider about having an MRI and a mammogram every year.  Breast cancer gene (BRCA) assessment is recommended for women who have family members with BRCA-related cancers. BRCA-related cancers include: ? Breast. ? Ovarian. ? Tubal. ? Peritoneal  cancers.  Results of the assessment will determine the need for genetic counseling and BRCA1 and BRCA2 testing.  Cervical Cancer Your health care provider may recommend that you be screened regularly for cancer of the pelvic organs (ovaries, uterus, and vagina). This screening involves a pelvic examination, including checking for microscopic changes to the surface of your cervix (Pap test). You may be encouraged to have this screening done every 3 years, beginning at age 21.  For women ages 17-65, health care providers may recommend pelvic exams and Pap testing every 3 years, or they may recommend the Pap and pelvic exam, combined with testing for human papilloma virus (HPV), every 5 years. Some types of HPV increase your risk of cervical cancer. Testing for HPV may also be done on women of any age with unclear Pap test results.  Other health care providers may not recommend any screening for nonpregnant women who are considered low risk for pelvic cancer and who do not have symptoms. Ask your health care provider if a screening pelvic exam is right for you.  If you have had past treatment for cervical cancer or a condition that could lead to cancer, you need Pap tests and screening for cancer for at least 20 years after your treatment. If Pap tests have been discontinued, your risk factors (such as having a new sexual partner) need to be reassessed to determine if screening should resume. Some women have medical problems that increase the chance of getting cervical cancer. In these cases, your health care provider may recommend more frequent screening and Pap tests.  Colorectal Cancer  This type of cancer can be detected and often prevented.  Routine colorectal cancer screening usually begins at 39 years of age and continues through 39 years of age.  Your health care provider may recommend screening at an earlier age if you have risk factors for colon cancer.  Your health care provider may also  recommend using home test kits to check for hidden blood in the stool.  A small camera at the end of a tube can be used to examine your colon directly (sigmoidoscopy or colonoscopy). This is done to check for the earliest forms of colorectal cancer.  Routine screening usually begins at age 43.  Direct examination of the colon should be repeated every 5-10 years through 39 years of age. However, you may need to be screened more often if early forms of precancerous polyps or small growths are found.  Skin Cancer  Check your skin from head to toe regularly.  Tell your health care provider about any new moles or changes in moles, especially if there is a change in a mole's shape or color.  Also tell your health care provider if you have a mole that is larger than the size of a pencil eraser.  Always use sunscreen. Apply sunscreen liberally and repeatedly throughout the day.  Protect yourself by wearing long sleeves, pants, a wide-brimmed hat, and  sunglasses whenever you are outside.  Heart disease, diabetes, and high blood pressure  High blood pressure causes heart disease and increases the risk of stroke. High blood pressure is more likely to develop in: ? People who have blood pressure in the high end of the normal range (130-139/85-89 mm Hg). ? People who are overweight or obese. ? People who are African American.  If you are 86-23 years of age, have your blood pressure checked every 3-5 years. If you are 44 years of age or older, have your blood pressure checked every year. You should have your blood pressure measured twice-once when you are at a hospital or clinic, and once when you are not at a hospital or clinic. Record the average of the two measurements. To check your blood pressure when you are not at a hospital or clinic, you can use: ? An automated blood pressure machine at a pharmacy. ? A home blood pressure monitor.  If you are between 55 years and 19 years old, ask your  health care provider if you should take aspirin to prevent strokes.  Have regular diabetes screenings. This involves taking a blood sample to check your fasting blood sugar level. ? If you are at a normal weight and have a low risk for diabetes, have this test once every three years after 39 years of age. ? If you are overweight and have a high risk for diabetes, consider being tested at a younger age or more often. Preventing infection Hepatitis B  If you have a higher risk for hepatitis B, you should be screened for this virus. You are considered at high risk for hepatitis B if: ? You were born in a country where hepatitis B is common. Ask your health care provider which countries are considered high risk. ? Your parents were born in a high-risk country, and you have not been immunized against hepatitis B (hepatitis B vaccine). ? You have HIV or AIDS. ? You use needles to inject street drugs. ? You live with someone who has hepatitis B. ? You have had sex with someone who has hepatitis B. ? You get hemodialysis treatment. ? You take certain medicines for conditions, including cancer, organ transplantation, and autoimmune conditions.  Hepatitis C  Blood testing is recommended for: ? Everyone born from [birth year] through 1967. ? Anyone with known risk factors for hepatitis C.  Sexually transmitted infections (STIs)  You should be screened for sexually transmitted infections (STIs) including gonorrhea and chlamydia if: ? You are sexually active and are younger than 39 years of age. ? You are older than 39 years of age and your health care provider tells you that you are at risk for this type of infection. ? Your sexual activity has changed since you were last screened and you are at an increased risk for chlamydia or gonorrhea. Ask your health care provider if you are at risk.  If you do not have HIV, but are at risk, it may be recommended that you take a prescription medicine daily to  prevent HIV infection. This is called pre-exposure prophylaxis (PrEP). You are considered at risk if: ? You are sexually active and do not regularly use condoms or know the HIV status of your partner(s). ? You take drugs by injection. ? You are sexually active with a partner who has HIV.  Talk with your health care provider about whether you are at high risk of being infected with HIV. If you choose to begin PrEP, you  should first be tested for HIV. You should then be tested every 3 months for as long as you are taking PrEP. Pregnancy  If you are premenopausal and you may become pregnant, ask your health care provider about preconception counseling.  If you may become pregnant, take 400 to 800 micrograms (mcg) of folic acid every day.  If you want to prevent pregnancy, talk to your health care provider about birth control (contraception). Osteoporosis and menopause  Osteoporosis is a disease in which the bones lose minerals and strength with aging. This can result in serious bone fractures. Your risk for osteoporosis can be identified using a bone density scan.  If you are 49 years of age or older, or if you are at risk for osteoporosis and fractures, ask your health care provider if you should be screened.  Ask your health care provider whether you should take a calcium or vitamin D supplement to lower your risk for osteoporosis.  Menopause may have certain physical symptoms and risks.  Hormone replacement therapy may reduce some of these symptoms and risks. Talk to your health care provider about whether hormone replacement therapy is right for you. Follow these instructions at home:  Schedule regular health, dental, and eye exams.  Stay current with your immunizations.  Do not use any tobacco products including cigarettes, chewing tobacco, or electronic cigarettes.  If you are pregnant, do not drink alcohol.  If you are breastfeeding, limit how much and how often you drink  alcohol.  Limit alcohol intake to no more than 1 drink per day for nonpregnant women. One drink equals 12 ounces of beer, 5 ounces of wine, or 1 ounces of hard liquor.  Do not use street drugs.  Do not share needles.  Ask your health care provider for help if you need support or information about quitting drugs.  Tell your health care provider if you often feel depressed.  Tell your health care provider if you have ever been abused or do not feel safe at home. This information is not intended to replace advice given to you by your health care provider. Make sure you discuss any questions you have with your health care provider. Document Released: 12/03/2010 Document Revised: 10/26/2015 Document Reviewed: 02/21/2015 Elsevier Interactive Patient Education  [employer].

## 2017-07-14 NOTE — Progress Notes (Signed)
Subjective:    Patient ID: Jessica Velazquez, female    DOB: [birth year], 37 y.o.   MRN: [id number]  HPI  37 year old patient who is seen today for a preventive health examination.  She is followed annually by gynecology. Social history.  Mother of 2 mother with a dementia who lives in the household.  Financial advisor with [employer]  Past Medical History:  Diagnosis Date  . No pertinent past medical history      Social History   Socioeconomic History  . Marital status: Married    Spouse name: Not on file  . Number of children: Not on file  . Years of education: Not on file  . Highest education level: Not on file  Social Needs  . Financial resource strain: Not on file  . Food insecurity - worry: Not on file  . Food insecurity - inability: Not on file  . Transportation needs - medical: Not on file  . Transportation needs - non-medical: Not on file  Occupational History  . Not on file  Tobacco Use  . Smoking status: Never Smoker  . Smokeless tobacco: Never Used  Substance and Sexual Activity  . Alcohol use: No    Alcohol/week: 0.0 oz  . Drug use: No  . Sexual activity: Yes  Other Topics Concern  . Not on file  Social History Narrative  . Not on file    Past Surgical History:  Procedure Laterality Date  . DILATION AND CURETTAGE OF UTERUS  09/2010    Family History  Problem Relation Age of Onset  . Arthritis Mother   . Hypertension Mother   . Hyperlipidemia Father   . Hypertension Father   . Diabetes Father   . Hypertension Brother     No Known Allergies  Current Outpatient Medications on File Prior to Visit  Medication Sig Dispense Refill  . ORSYTHIA 0.1-20 MG-MCG tablet      No current facility-administered medications on file prior to visit.     BP 102/70 (BP Location: Right Arm, Patient Position: Sitting, Cuff Size: Normal)   Pulse 74   Temp 98.3 F (36.8 C) (Oral)   Ht 5' 2.5" (1.588 m)   Wt 158 lb 6.4 oz (71.8 kg)   LMP 06/23/2017    SpO2 97%   BMI 28.51 kg/m      Review of Systems  Constitutional: Negative.   HENT: Negative for congestion, dental problem, hearing loss, rhinorrhea, sinus pressure, sore throat and tinnitus.   Eyes: Negative for pain, discharge and visual disturbance.  Respiratory: Negative for cough and shortness of breath.   Cardiovascular: Negative for chest pain, palpitations and leg swelling.  Gastrointestinal: Negative for abdominal distention, abdominal pain, blood in stool, constipation, diarrhea, nausea and vomiting.  Genitourinary: Negative for difficulty urinating, dysuria, flank pain, frequency, hematuria, pelvic pain, urgency, vaginal bleeding, vaginal discharge and vaginal pain.  Musculoskeletal: Negative for arthralgias, gait problem and joint swelling.  Skin: Negative for rash.  Neurological: Negative for dizziness, syncope, speech difficulty, weakness, numbness and headaches.  Hematological: Negative for adenopathy.  Psychiatric/Behavioral: Negative for agitation, behavioral problems and dysphoric mood. The patient is not nervous/anxious.        Objective:   Physical Exam  Constitutional: She is oriented to person, place, and time. She appears well-developed and well-nourished.  Blood pressure low normal  HENT:  Head: Normocephalic.  Right Ear: External ear normal.  Left Ear: External ear normal.  Mouth/Throat: Oropharynx is clear and moist.  Eyes: Conjunctivae and  EOM are normal. Pupils are equal, round, and reactive to light.  Neck: Normal range of motion. Neck supple. No thyromegaly present.  Cardiovascular: Normal rate, regular rhythm, normal heart sounds and intact distal pulses.  Pulmonary/Chest: Effort normal and breath sounds normal.  Abdominal: Soft. Bowel sounds are normal. She exhibits no mass. There is no tenderness.  Musculoskeletal: Normal range of motion.  Lymphadenopathy:    She has no cervical adenopathy.  Neurological: She is alert and oriented to person,  place, and time.  Skin: Skin is warm and dry. No rash noted.  Psychiatric: She has a normal mood and affect. Her behavior is normal.          Assessment & Plan:   Preventive health examination.  We will check updated lab overweight.  Modest weight loss encouraged.   Follow-up OB/GYN Return here in 1 year or as needed  Rogelia Boga

## 2017-07-18 ENCOUNTER — Ambulatory Visit: Payer: Self-pay | Admitting: Psychology

## 2017-07-21 ENCOUNTER — Ambulatory Visit: Payer: BLUE CROSS/BLUE SHIELD | Admitting: Psychology

## 2017-07-25 ENCOUNTER — Ambulatory Visit: Payer: Self-pay | Admitting: Psychology

## 2017-08-22 ENCOUNTER — Encounter: Payer: Self-pay | Admitting: Family Medicine

## 2017-08-22 ENCOUNTER — Ambulatory Visit: Payer: BLUE CROSS/BLUE SHIELD | Admitting: Family Medicine

## 2017-08-22 VITALS — BP 104/74 | HR 68 | Temp 98.3°F | Ht 62.5 in | Wt 157.0 lb

## 2017-08-22 DIAGNOSIS — J029 Acute pharyngitis, unspecified: Secondary | ICD-10-CM | POA: Diagnosis not present

## 2017-08-22 DIAGNOSIS — J020 Streptococcal pharyngitis: Secondary | ICD-10-CM

## 2017-08-22 DIAGNOSIS — R0982 Postnasal drip: Secondary | ICD-10-CM

## 2017-08-22 LAB — POCT RAPID STREP A (OFFICE): Rapid Strep A Screen: POSITIVE — AB

## 2017-08-22 MED ORDER — FLUTICASONE PROPIONATE 50 MCG/ACT NA SUSP: 1.0000 | Freq: Every day | NASAL | 0 refills | Status: DC

## 2017-08-22 MED ORDER — AMOXICILLIN 500 MG PO CAPS: 1000.0000 mg | ORAL_CAPSULE | Freq: Every day | ORAL | 0 refills | Status: AC

## 2017-08-22 NOTE — Patient Instructions (Signed)
Strep Throat Strep throat is a bacterial infection of the throat. Your health care provider may call the infection tonsillitis or pharyngitis, depending on whether there is swelling in the tonsils or at the back of the throat. Strep throat is most common during the cold months of the year in children who are 5-39 years of age, but it can happen during any season in people of any age. This infection is spread from person to person (contagious) through coughing, sneezing, or close contact. What are the causes? Strep throat is caused by the bacteria called Streptococcus pyogenes. What increases the risk? This condition is more likely to develop in:  People who spend time in crowded places where the infection can spread easily.  People who have close contact with someone who has strep throat.  What are the signs or symptoms? Symptoms of this condition include:  Fever or chills.  Redness, swelling, or pain in the tonsils or throat.  Pain or difficulty when swallowing.  White or yellow spots on the tonsils or throat.  Swollen, tender glands in the neck or under the jaw.  Red rash all over the body (rare).  How is this diagnosed? This condition is diagnosed by performing a rapid strep test or by taking a swab of your throat (throat culture test). Results from a rapid strep test are usually ready in a few minutes, but throat culture test results are available after one or two days. How is this treated? This condition is treated with antibiotic medicine. Follow these instructions at home: Medicines  Take over-the-counter and prescription medicines only as told by your health care provider.  Take your antibiotic as told by your health care provider. Do not stop taking the antibiotic even if you start to feel better.  Have family members who also have a sore throat or fever tested for strep throat. They may need antibiotics if they have the strep infection. Eating and drinking  Do not  share food, drinking cups, or personal items that could cause the infection to spread to other people.  If swallowing is difficult, try eating soft foods until your sore throat feels better.  Drink enough fluid to keep your urine clear or pale yellow. General instructions  Gargle with a salt-water mixture 3-4 times per day or as needed. To make a salt-water mixture, completely dissolve -1 tsp of salt in 1 cup of warm water.  Make sure that all household members wash their hands well.  Get plenty of rest.  Stay home from school or work until you have been taking antibiotics for 24 hours.  Keep all follow-up visits as told by your health care provider. This is important. Contact a health care provider if:  The glands in your neck continue to get bigger.  You develop a rash, cough, or earache.  You cough up a thick liquid that is green, yellow-brown, or bloody.  You have pain or discomfort that does not get better with medicine.  Your problems seem to be getting worse rather than better.  You have a fever. Get help right away if:  You have new symptoms, such as vomiting, severe headache, stiff or painful neck, chest pain, or shortness of breath.  You have severe throat pain, drooling, or changes in your voice.  You have swelling of the neck, or the skin on the neck becomes red and tender.  You have signs of dehydration, such as fatigue, dry mouth, and decreased urination.  You become increasingly sleepy, or   you cannot wake up completely.  Your joints become red or painful. This information is not intended to replace advice given to you by your health care provider. Make sure you discuss any questions you have with your health care provider. Document Released: 05/17/2000 Document Revised: 01/17/2016 Document Reviewed: 09/12/2014 Elsevier Interactive Patient Education  2018 Elsevier Inc.  Postnasal Drip Postnasal drip is the feeling of mucus going down the back of your  throat. Mucus is a slimy substance that moistens and cleans your nose and throat, as well as the air pockets in face bones near your forehead and cheeks (sinuses). Small amounts of mucus pass from your nose and sinuses down the back of your throat all the time. This is normal. When you produce too much mucus or the mucus gets too thick, you can feel it. Some common causes of postnasal drip include:  Having more mucus because of: ? A cold or the flu. ? Allergies. ? Cold air. ? Certain medicines.  Having more mucus that is thicker because of: ? A sinus or nasal infection. ? Dry air. ? A food allergy.  Follow these instructions at home: Relieving discomfort  Gargle with a salt-water mixture 3-4 times a day or as needed. To make a salt-water mixture, completely dissolve -1 tsp of salt in 1 cup of warm water.  If the air in your home is dry, use a humidifier to add moisture to the air.  Use a saline spray or container (neti pot) to flush out the nose (nasal irrigation). These methods can help clear away mucus and keep the nasal passages moist. General instructions  Take over-the-counter and prescription medicines only as told by your health care provider.  Follow instructions from your health care provider about eating or drinking restrictions. You may need to avoid caffeine.  Avoid things that you know you are allergic to (allergens), like dust, mold, pollen, pets, or certain foods.  Drink enough fluid to keep your urine pale yellow.  Keep all follow-up visits as told by your health care provider. This is important. Contact a health care provider if:  You have a fever.  You have a sore throat.  You have difficulty swallowing.  You have headache.  You have sinus pain.  You have a cough that does not go away.  The mucus from your nose becomes thick and is green or yellow in color.  You have cold or flu symptoms that last more than 10 days. Summary  Postnasal drip is the  feeling of mucus going down the back of your throat.  If your health care provider approves, use nasal irrigation or a nasal spray 2?4 times a day.  Avoid things that you know you are allergic to (allergens), like dust, mold, pollen, pets, or certain foods. This information is not intended to replace advice given to you by your health care provider. Make sure you discuss any questions you have with your health care provider. Document Released: 09/02/2016 Document Revised: 09/02/2016 Document Reviewed: 09/02/2016 Elsevier Interactive Patient Education  [employer]2018 Elsevier Inc.

## 2017-08-22 NOTE — Progress Notes (Signed)
Subjective:    Patient ID: Jessica Velazquez, female    DOB: [birth year], [age] y.o.   MRN: [id number]  Chief Complaint  Patient presents with  . Sore Throat    HPI Patient was seen today for acute concern.  Pt endorses feeling tired, sore throat, hoarse voice and nasal congestion x 1 wk.  Pt also states she feels like she has had a cough since November.  Pt has taken Nyquil for her symptoms.  Sick contacts include patient's daughter who was sick last week and now has conjunctivitis.  Past Medical History:  Diagnosis Date  . No pertinent past medical history     No Known Allergies  ROS General: Denies fever, chills, night sweats, changes in weight, changes in appetite   + fatigue HEENT: Denies headaches, ear pain, changes in vision, rhinorrhea    +sore throat, nasal congestion, hoarse voice CV: Denies CP, palpitations, SOB, orthopnea Pulm: Denies SOB, wheezing   +ongoing cough?  GI: Denies abdominal pain, nausea, vomiting, diarrhea, constipation GU: Denies dysuria, hematuria, frequency, vaginal discharge Msk: Denies muscle cramps, joint pains Neuro: Denies weakness, numbness, tingling Skin: Denies rashes, bruising Psych: Denies depression, anxiety, hallucinations     Objective:    Blood pressure 104/74, pulse 68, temperature 98.3 F (36.8 C), temperature source Oral, height 5' 2.5" (1.588 m), weight 157 lb (71.2 kg), SpO2 98 %.   Gen. Pleasant, well-nourished, in no distress, normal affect   HEENT: [place]/AT, face symmetric, conjunctiva clear, no scleral icterus, PERRLA, nares patent without drainage, pharynx with erythema, mild exudate, enlarged tonsils-not kissing, postnasal drainage.  TMs full bilaterally.  Mild cervical lymphadenopathy Lungs: no accessory muscle use, CTAB, no wheezes or rales Cardiovascular: RRR, no m/r/g, no peripheral edema Abdomen: BS present, soft, NT/ND Neuro:  A&Ox3, CN II-XII intact, normal gait    Wt Readings from Last 3 Encounters:  08/22/17  157 lb (71.2 kg)  07/14/17 158 lb 6.4 oz (71.8 kg)  04/19/17 160 lb 12 oz (72.9 kg)    Lab Results  Component Value Date   WBC 7.5 07/14/2017   HGB 13.9 07/14/2017   HCT 40.6 07/14/2017   PLT 293 07/14/2017   GLUCOSE 89 07/14/2017   CHOL 181 07/14/2017   TRIG 102.0 07/14/2017   HDL 42.00 07/14/2017   LDLCALC 118 (H) 07/14/2017   ALT 12 07/14/2017   AST 11 07/14/2017   NA 138 07/14/2017   K 4.1 07/14/2017   CL 103 07/14/2017   CREATININE 0.61 07/14/2017   BUN 14 07/14/2017   CO2 27 07/14/2017   TSH 1.40 07/14/2017    Assessment/Plan:  Strep pharyngitis -Rapid strep positive -Patient given handout.  Encouraged to change toothbrush  And avoid eating or drinking after others. - Plan: amoxicillin (AMOXIL) 500 MG capsule  Sore throat  - Plan: POCT rapid strep A  Post-nasal drainage  -Likely caused by allergies.  Patient should consider a daily allergy pill - Plan: fluticasone (FLONASE) 50 MCG/ACT nasal spray  Follow-up PRN  Abbe AmsterdamShannon Karthik Whittinghill, MD

## 2017-09-10 DIAGNOSIS — Z124 Encounter for screening for malignant neoplasm of cervix: Secondary | ICD-10-CM | POA: Diagnosis not present

## 2017-09-10 DIAGNOSIS — Z01419 Encounter for gynecological examination (general) (routine) without abnormal findings: Secondary | ICD-10-CM | POA: Diagnosis not present

## 2017-09-10 DIAGNOSIS — Z130 Encounter for screening for diseases of the blood and blood-forming organs and certain disorders involving the immune mechanism: Secondary | ICD-10-CM | POA: Diagnosis not present

## 2017-09-10 DIAGNOSIS — Z1389 Encounter for screening for other disorder: Secondary | ICD-10-CM | POA: Diagnosis not present

## 2017-09-11 DIAGNOSIS — R87619 Unspecified abnormal cytological findings in specimens from cervix uteri: Secondary | ICD-10-CM | POA: Diagnosis not present

## 2017-09-11 DIAGNOSIS — R87610 Atypical squamous cells of undetermined significance on cytologic smear of cervix (ASC-US): Secondary | ICD-10-CM | POA: Diagnosis not present

## 2017-09-11 DIAGNOSIS — Z124 Encounter for screening for malignant neoplasm of cervix: Secondary | ICD-10-CM | POA: Diagnosis not present

## 2017-09-13 ENCOUNTER — Other Ambulatory Visit: Payer: Self-pay | Admitting: Family Medicine

## 2017-09-13 DIAGNOSIS — R0982 Postnasal drip: Secondary | ICD-10-CM

## 2018-04-08 DIAGNOSIS — Z1331 Encounter for screening for depression: Secondary | ICD-10-CM | POA: Diagnosis not present

## 2018-04-08 DIAGNOSIS — Z0000 Encounter for general adult medical examination without abnormal findings: Secondary | ICD-10-CM | POA: Diagnosis not present

## 2018-04-09 DIAGNOSIS — Z0000 Encounter for general adult medical examination without abnormal findings: Secondary | ICD-10-CM | POA: Diagnosis not present

## 2018-04-15 DIAGNOSIS — H5213 Myopia, bilateral: Secondary | ICD-10-CM | POA: Diagnosis not present

## 2018-04-15 DIAGNOSIS — H04123 Dry eye syndrome of bilateral lacrimal glands: Secondary | ICD-10-CM | POA: Diagnosis not present

## 2018-11-11 DIAGNOSIS — R1312 Dysphagia, oropharyngeal phase: Secondary | ICD-10-CM | POA: Diagnosis not present

## 2018-11-11 DIAGNOSIS — K219 Gastro-esophageal reflux disease without esophagitis: Secondary | ICD-10-CM | POA: Diagnosis not present

## 2018-11-13 ENCOUNTER — Other Ambulatory Visit (HOSPITAL_COMMUNITY): Payer: Self-pay | Admitting: Otolaryngology

## 2018-11-13 DIAGNOSIS — R1310 Dysphagia, unspecified: Secondary | ICD-10-CM

## 2018-11-18 ENCOUNTER — Other Ambulatory Visit: Payer: Self-pay

## 2018-11-18 ENCOUNTER — Ambulatory Visit (HOSPITAL_COMMUNITY)
Admission: RE | Admit: 2018-11-18 | Discharge: 2018-11-18 | Disposition: A | Discharge status: Home / Self Care | Payer: BC Managed Care – PPO | Source: Ambulatory Visit | Attending: Otolaryngology | Admitting: Otolaryngology

## 2018-11-18 ENCOUNTER — Other Ambulatory Visit (HOSPITAL_COMMUNITY): Payer: Self-pay | Admitting: Otolaryngology

## 2018-11-18 DIAGNOSIS — R1310 Dysphagia, unspecified: Secondary | ICD-10-CM

## 2018-12-11 DIAGNOSIS — K219 Gastro-esophageal reflux disease without esophagitis: Secondary | ICD-10-CM | POA: Diagnosis not present

## 2018-12-11 DIAGNOSIS — R1312 Dysphagia, oropharyngeal phase: Secondary | ICD-10-CM | POA: Diagnosis not present

## 2019-01-21 DIAGNOSIS — Z23 Encounter for immunization: Secondary | ICD-10-CM | POA: Diagnosis not present

## 2019-03-05 DIAGNOSIS — E7849 Other hyperlipidemia: Secondary | ICD-10-CM | POA: Diagnosis not present

## 2019-04-05 DIAGNOSIS — Z0000 Encounter for general adult medical examination without abnormal findings: Secondary | ICD-10-CM | POA: Diagnosis not present

## 2019-04-06 DIAGNOSIS — Z0000 Encounter for general adult medical examination without abnormal findings: Secondary | ICD-10-CM | POA: Diagnosis not present

## 2019-04-06 DIAGNOSIS — E7849 Other hyperlipidemia: Secondary | ICD-10-CM | POA: Diagnosis not present

## 2019-04-12 DIAGNOSIS — Z0000 Encounter for general adult medical examination without abnormal findings: Secondary | ICD-10-CM | POA: Diagnosis not present

## 2019-04-12 DIAGNOSIS — Z1331 Encounter for screening for depression: Secondary | ICD-10-CM | POA: Diagnosis not present

## 2019-04-13 DIAGNOSIS — Z1389 Encounter for screening for other disorder: Secondary | ICD-10-CM | POA: Diagnosis not present

## 2019-04-13 DIAGNOSIS — Z130 Encounter for screening for diseases of the blood and blood-forming organs and certain disorders involving the immune mechanism: Secondary | ICD-10-CM | POA: Diagnosis not present

## 2019-04-13 DIAGNOSIS — Z6828 Body mass index (BMI) 28.0-28.9, adult: Secondary | ICD-10-CM | POA: Diagnosis not present

## 2019-04-13 DIAGNOSIS — Z01419 Encounter for gynecological examination (general) (routine) without abnormal findings: Secondary | ICD-10-CM | POA: Diagnosis not present

## 2019-04-14 DIAGNOSIS — Z1231 Encounter for screening mammogram for malignant neoplasm of breast: Secondary | ICD-10-CM | POA: Diagnosis not present

## 2019-04-23 DIAGNOSIS — H04123 Dry eye syndrome of bilateral lacrimal glands: Secondary | ICD-10-CM | POA: Diagnosis not present

## 2019-04-23 DIAGNOSIS — H5213 Myopia, bilateral: Secondary | ICD-10-CM | POA: Diagnosis not present

## 2019-06-16 ENCOUNTER — Ambulatory Visit: Payer: BC Managed Care – PPO | Attending: Internal Medicine

## 2019-06-16 DIAGNOSIS — Z23 Encounter for immunization: Secondary | ICD-10-CM | POA: Insufficient documentation

## 2019-06-16 NOTE — Progress Notes (Signed)
   Covid-19 Vaccination Clinic  Name:  Jessica Velazquez    MRN: [id number] DOB: 1981-05-05  06/16/2019  Jessica Velazquez was observed post Covid-19 immunization for 30 minutes based on pre-vaccination screening without incidence. She was provided with Vaccine Information Sheet and instruction to access the V-Safe system.   Jessica Velazquez was instructed to call 911 with any severe reactions post vaccine: Marland Kitchen Difficulty breathing  . Swelling of your face and throat  . A fast heartbeat  . A bad rash all over your body  . Dizziness and weakness    Immunizations Administered    Name Date Dose VIS Date Route   Pfizer COVID-19 Vaccine 06/16/2019 11:13 AM 0.3 mL 05/14/2019 Intramuscular   Manufacturer: [employer], [employer]   Lot: [id number]   NDC: [address]-0

## 2019-07-07 ENCOUNTER — Ambulatory Visit: Payer: BC Managed Care – PPO | Attending: Internal Medicine

## 2019-07-07 DIAGNOSIS — Z23 Encounter for immunization: Secondary | ICD-10-CM | POA: Insufficient documentation

## 2019-07-07 NOTE — Progress Notes (Signed)
   Covid-19 Vaccination Clinic  Name:  Jessica Velazquez    MRN: [id number] DOB: 1980/11/27  07/07/2019  [name] was observed post Covid-19 immunization for 15 minutes without incidence. She was provided with Vaccine Information Sheet and instruction to access the V-Safe system.   [name] was instructed to call 911 with any severe reactions post vaccine: Marland Kitchen Difficulty breathing  . Swelling of your face and throat  . A fast heartbeat  . A bad rash all over your body  . Dizziness and weakness    Immunizations Administered    Name Date Dose VIS Date Route   Pfizer COVID-19 Vaccine 07/07/2019 10:41 AM 0.3 mL 05/14/2019 Intramuscular   Manufacturer: [employer], [employer]   Lot: [id number]   NDC: [address]-8

## 2020-01-20 DIAGNOSIS — Z20822 Contact with and (suspected) exposure to covid-19: Secondary | ICD-10-CM | POA: Diagnosis not present

## 2020-04-19 DIAGNOSIS — Z01419 Encounter for gynecological examination (general) (routine) without abnormal findings: Secondary | ICD-10-CM | POA: Diagnosis not present

## 2020-04-19 DIAGNOSIS — Z1231 Encounter for screening mammogram for malignant neoplasm of breast: Secondary | ICD-10-CM | POA: Diagnosis not present

## 2020-04-19 DIAGNOSIS — Z130 Encounter for screening for diseases of the blood and blood-forming organs and certain disorders involving the immune mechanism: Secondary | ICD-10-CM | POA: Diagnosis not present

## 2020-04-19 DIAGNOSIS — Z6829 Body mass index (BMI) 29.0-29.9, adult: Secondary | ICD-10-CM | POA: Diagnosis not present

## 2020-04-19 DIAGNOSIS — Z1389 Encounter for screening for other disorder: Secondary | ICD-10-CM | POA: Diagnosis not present

## 2020-04-26 DIAGNOSIS — Z20822 Contact with and (suspected) exposure to covid-19: Secondary | ICD-10-CM | POA: Diagnosis not present

## 2020-05-04 DIAGNOSIS — H5213 Myopia, bilateral: Secondary | ICD-10-CM | POA: Diagnosis not present

## 2020-05-04 DIAGNOSIS — H43811 Vitreous degeneration, right eye: Secondary | ICD-10-CM | POA: Diagnosis not present

## 2020-05-25 DIAGNOSIS — E785 Hyperlipidemia, unspecified: Secondary | ICD-10-CM | POA: Diagnosis not present

## 2020-06-01 DIAGNOSIS — E785 Hyperlipidemia, unspecified: Secondary | ICD-10-CM | POA: Diagnosis not present

## 2020-06-01 DIAGNOSIS — Z0000 Encounter for general adult medical examination without abnormal findings: Secondary | ICD-10-CM | POA: Diagnosis not present

## 2020-06-01 DIAGNOSIS — Z1339 Encounter for screening examination for other mental health and behavioral disorders: Secondary | ICD-10-CM | POA: Diagnosis not present

## 2020-06-01 DIAGNOSIS — Z1331 Encounter for screening for depression: Secondary | ICD-10-CM | POA: Diagnosis not present

## 2020-06-23 DIAGNOSIS — Z1152 Encounter for screening for COVID-19: Secondary | ICD-10-CM | POA: Diagnosis not present

## 2020-07-14 DIAGNOSIS — N390 Urinary tract infection, site not specified: Secondary | ICD-10-CM | POA: Diagnosis not present

## 2020-07-14 DIAGNOSIS — R300 Dysuria: Secondary | ICD-10-CM | POA: Diagnosis not present

## 2020-09-22 DIAGNOSIS — Z20822 Contact with and (suspected) exposure to covid-19: Secondary | ICD-10-CM | POA: Diagnosis not present

## 2020-09-25 DIAGNOSIS — Z20822 Contact with and (suspected) exposure to covid-19: Secondary | ICD-10-CM | POA: Diagnosis not present

## 2020-11-03 DIAGNOSIS — R070 Pain in throat: Secondary | ICD-10-CM | POA: Diagnosis not present

## 2020-11-03 DIAGNOSIS — J351 Hypertrophy of tonsils: Secondary | ICD-10-CM | POA: Diagnosis not present

## 2020-12-15 ENCOUNTER — Other Ambulatory Visit: Payer: Self-pay | Admitting: Internal Medicine

## 2020-12-15 DIAGNOSIS — E785 Hyperlipidemia, unspecified: Secondary | ICD-10-CM

## 2020-12-17 DIAGNOSIS — Z20822 Contact with and (suspected) exposure to covid-19: Secondary | ICD-10-CM | POA: Diagnosis not present

## 2021-01-22 ENCOUNTER — Ambulatory Visit
Admission: RE | Admit: 2021-01-22 | Discharge: 2021-01-22 | Disposition: A | Discharge status: Home / Self Care | Payer: No Typology Code available for payment source | Source: Ambulatory Visit | Attending: Internal Medicine | Admitting: Internal Medicine

## 2021-01-22 DIAGNOSIS — E785 Hyperlipidemia, unspecified: Secondary | ICD-10-CM

## 2021-04-20 DIAGNOSIS — Z1231 Encounter for screening mammogram for malignant neoplasm of breast: Secondary | ICD-10-CM | POA: Diagnosis not present

## 2021-04-20 DIAGNOSIS — Z6828 Body mass index (BMI) 28.0-28.9, adult: Secondary | ICD-10-CM | POA: Diagnosis not present

## 2021-04-20 DIAGNOSIS — Z130 Encounter for screening for diseases of the blood and blood-forming organs and certain disorders involving the immune mechanism: Secondary | ICD-10-CM | POA: Diagnosis not present

## 2021-04-20 DIAGNOSIS — Z01419 Encounter for gynecological examination (general) (routine) without abnormal findings: Secondary | ICD-10-CM | POA: Diagnosis not present

## 2021-04-20 DIAGNOSIS — Z124 Encounter for screening for malignant neoplasm of cervix: Secondary | ICD-10-CM | POA: Diagnosis not present

## 2021-04-20 DIAGNOSIS — Z1151 Encounter for screening for human papillomavirus (HPV): Secondary | ICD-10-CM | POA: Diagnosis not present

## 2021-04-30 ENCOUNTER — Other Ambulatory Visit: Payer: Self-pay | Admitting: Obstetrics and Gynecology

## 2021-04-30 DIAGNOSIS — R928 Other abnormal and inconclusive findings on diagnostic imaging of breast: Secondary | ICD-10-CM

## 2021-05-07 DIAGNOSIS — H5213 Myopia, bilateral: Secondary | ICD-10-CM | POA: Diagnosis not present

## 2021-05-07 DIAGNOSIS — H04123 Dry eye syndrome of bilateral lacrimal glands: Secondary | ICD-10-CM | POA: Diagnosis not present

## 2021-06-06 ENCOUNTER — Ambulatory Visit
Admission: RE | Admit: 2021-06-06 | Discharge: 2021-06-06 | Disposition: A | Discharge status: Home / Self Care | Payer: BC Managed Care – PPO | Source: Ambulatory Visit | Attending: Obstetrics and Gynecology | Admitting: Obstetrics and Gynecology

## 2021-06-06 ENCOUNTER — Other Ambulatory Visit: Payer: Self-pay

## 2021-06-06 ENCOUNTER — Other Ambulatory Visit: Payer: Self-pay | Admitting: Obstetrics and Gynecology

## 2021-06-06 ENCOUNTER — Ambulatory Visit
Admission: RE | Admit: 2021-06-06 | Discharge: 2021-06-06 | Disposition: A | Discharge status: Home / Self Care | Payer: No Typology Code available for payment source | Source: Ambulatory Visit | Attending: Obstetrics and Gynecology | Admitting: Obstetrics and Gynecology

## 2021-06-06 DIAGNOSIS — R928 Other abnormal and inconclusive findings on diagnostic imaging of breast: Secondary | ICD-10-CM

## 2021-06-06 DIAGNOSIS — R922 Inconclusive mammogram: Secondary | ICD-10-CM | POA: Diagnosis not present

## 2021-06-06 DIAGNOSIS — N6489 Other specified disorders of breast: Secondary | ICD-10-CM | POA: Diagnosis not present

## 2021-06-15 DIAGNOSIS — E785 Hyperlipidemia, unspecified: Secondary | ICD-10-CM | POA: Diagnosis not present

## 2021-06-25 DIAGNOSIS — Z1212 Encounter for screening for malignant neoplasm of rectum: Secondary | ICD-10-CM | POA: Diagnosis not present

## 2021-06-25 DIAGNOSIS — Z0000 Encounter for general adult medical examination without abnormal findings: Secondary | ICD-10-CM | POA: Diagnosis not present

## 2021-06-25 DIAGNOSIS — H938X9 Other specified disorders of ear, unspecified ear: Secondary | ICD-10-CM | POA: Diagnosis not present

## 2021-07-13 DIAGNOSIS — H2000 Unspecified acute and subacute iridocyclitis: Secondary | ICD-10-CM | POA: Diagnosis not present

## 2021-07-13 DIAGNOSIS — H5712 Ocular pain, left eye: Secondary | ICD-10-CM | POA: Diagnosis not present

## 2021-07-16 DIAGNOSIS — H2000 Unspecified acute and subacute iridocyclitis: Secondary | ICD-10-CM | POA: Diagnosis not present

## 2021-12-05 ENCOUNTER — Ambulatory Visit
Admission: RE | Admit: 2021-12-05 | Discharge: 2021-12-05 | Disposition: A | Discharge status: Home / Self Care | Payer: BC Managed Care – PPO | Source: Ambulatory Visit | Attending: Obstetrics and Gynecology | Admitting: Obstetrics and Gynecology

## 2021-12-05 ENCOUNTER — Other Ambulatory Visit: Payer: Self-pay | Admitting: Obstetrics and Gynecology

## 2021-12-05 DIAGNOSIS — R928 Other abnormal and inconclusive findings on diagnostic imaging of breast: Secondary | ICD-10-CM

## 2021-12-05 DIAGNOSIS — R922 Inconclusive mammogram: Secondary | ICD-10-CM | POA: Diagnosis not present

## 2022-05-13 DIAGNOSIS — H5213 Myopia, bilateral: Secondary | ICD-10-CM | POA: Diagnosis not present

## 2022-05-13 DIAGNOSIS — H43811 Vitreous degeneration, right eye: Secondary | ICD-10-CM | POA: Diagnosis not present

## 2022-05-20 DIAGNOSIS — Z01419 Encounter for gynecological examination (general) (routine) without abnormal findings: Secondary | ICD-10-CM | POA: Diagnosis not present

## 2022-05-20 DIAGNOSIS — Z3041 Encounter for surveillance of contraceptive pills: Secondary | ICD-10-CM | POA: Diagnosis not present

## 2022-05-20 DIAGNOSIS — Z1389 Encounter for screening for other disorder: Secondary | ICD-10-CM | POA: Diagnosis not present

## 2022-06-10 ENCOUNTER — Ambulatory Visit
Admission: RE | Admit: 2022-06-10 | Discharge: 2022-06-10 | Disposition: A | Discharge status: Home / Self Care | Payer: BC Managed Care – PPO | Source: Ambulatory Visit | Attending: Obstetrics and Gynecology | Admitting: Obstetrics and Gynecology

## 2022-06-10 DIAGNOSIS — R928 Other abnormal and inconclusive findings on diagnostic imaging of breast: Secondary | ICD-10-CM

## 2022-06-10 DIAGNOSIS — R922 Inconclusive mammogram: Secondary | ICD-10-CM | POA: Diagnosis not present

## 2022-06-24 DIAGNOSIS — R070 Pain in throat: Secondary | ICD-10-CM | POA: Diagnosis not present

## 2022-06-24 DIAGNOSIS — J351 Hypertrophy of tonsils: Secondary | ICD-10-CM | POA: Diagnosis not present

## 2022-06-24 DIAGNOSIS — K219 Gastro-esophageal reflux disease without esophagitis: Secondary | ICD-10-CM | POA: Diagnosis not present

## 2022-07-01 DIAGNOSIS — R7989 Other specified abnormal findings of blood chemistry: Secondary | ICD-10-CM | POA: Diagnosis not present

## 2022-07-01 DIAGNOSIS — E785 Hyperlipidemia, unspecified: Secondary | ICD-10-CM | POA: Diagnosis not present

## 2022-07-08 DIAGNOSIS — Z0000 Encounter for general adult medical examination without abnormal findings: Secondary | ICD-10-CM | POA: Diagnosis not present

## 2022-07-08 DIAGNOSIS — K219 Gastro-esophageal reflux disease without esophagitis: Secondary | ICD-10-CM | POA: Diagnosis not present

## 2022-07-08 DIAGNOSIS — R82998 Other abnormal findings in urine: Secondary | ICD-10-CM | POA: Diagnosis not present

## 2022-07-08 DIAGNOSIS — E785 Hyperlipidemia, unspecified: Secondary | ICD-10-CM | POA: Diagnosis not present

## 2022-11-11 DIAGNOSIS — E785 Hyperlipidemia, unspecified: Secondary | ICD-10-CM | POA: Diagnosis not present

## 2022-12-10 DIAGNOSIS — J029 Acute pharyngitis, unspecified: Secondary | ICD-10-CM | POA: Diagnosis not present

## 2022-12-10 DIAGNOSIS — J101 Influenza due to other identified influenza virus with other respiratory manifestations: Secondary | ICD-10-CM | POA: Diagnosis not present

## 2023-01-06 DIAGNOSIS — L858 Other specified epidermal thickening: Secondary | ICD-10-CM | POA: Diagnosis not present

## 2023-01-06 DIAGNOSIS — L821 Other seborrheic keratosis: Secondary | ICD-10-CM | POA: Diagnosis not present

## 2023-01-09 DIAGNOSIS — E669 Obesity, unspecified: Secondary | ICD-10-CM | POA: Diagnosis not present

## 2023-01-09 DIAGNOSIS — K219 Gastro-esophageal reflux disease without esophagitis: Secondary | ICD-10-CM | POA: Diagnosis not present

## 2023-01-09 DIAGNOSIS — K5900 Constipation, unspecified: Secondary | ICD-10-CM | POA: Diagnosis not present

## 2023-01-09 DIAGNOSIS — R1310 Dysphagia, unspecified: Secondary | ICD-10-CM | POA: Diagnosis not present

## 2023-01-27 ENCOUNTER — Other Ambulatory Visit (HOSPITAL_COMMUNITY): Payer: Self-pay | Admitting: Gastroenterology

## 2023-01-27 DIAGNOSIS — R1310 Dysphagia, unspecified: Secondary | ICD-10-CM

## 2023-01-27 DIAGNOSIS — R1013 Epigastric pain: Secondary | ICD-10-CM

## 2023-01-27 DIAGNOSIS — K219 Gastro-esophageal reflux disease without esophagitis: Secondary | ICD-10-CM | POA: Diagnosis not present

## 2023-02-07 ENCOUNTER — Ambulatory Visit (HOSPITAL_COMMUNITY)
Admission: RE | Admit: 2023-02-07 | Discharge: 2023-02-07 | Disposition: A | Discharge status: Home / Self Care | Payer: BC Managed Care – PPO | Source: Ambulatory Visit | Attending: Gastroenterology | Admitting: Gastroenterology

## 2023-02-07 DIAGNOSIS — R1013 Epigastric pain: Secondary | ICD-10-CM | POA: Diagnosis not present

## 2023-02-07 DIAGNOSIS — R1310 Dysphagia, unspecified: Secondary | ICD-10-CM | POA: Diagnosis not present

## 2023-02-07 DIAGNOSIS — Z0389 Encounter for observation for other suspected diseases and conditions ruled out: Secondary | ICD-10-CM | POA: Diagnosis not present

## 2023-02-07 MED ORDER — TECHNETIUM TC 99M SULFUR COLLOID
2.2000 | Freq: Once | INTRAVENOUS | Status: AC | PRN
  Administered 2023-02-07: 2.2 via ORAL

## 2023-05-19 DIAGNOSIS — H5213 Myopia, bilateral: Secondary | ICD-10-CM | POA: Diagnosis not present

## 2023-05-19 DIAGNOSIS — H52203 Unspecified astigmatism, bilateral: Secondary | ICD-10-CM | POA: Diagnosis not present

## 2023-05-19 DIAGNOSIS — H43811 Vitreous degeneration, right eye: Secondary | ICD-10-CM | POA: Diagnosis not present

## 2023-06-05 ENCOUNTER — Other Ambulatory Visit: Payer: Self-pay | Admitting: Obstetrics and Gynecology

## 2023-06-05 DIAGNOSIS — N6489 Other specified disorders of breast: Secondary | ICD-10-CM

## 2023-06-10 DIAGNOSIS — Z01419 Encounter for gynecological examination (general) (routine) without abnormal findings: Secondary | ICD-10-CM | POA: Diagnosis not present

## 2023-06-24 IMAGING — CT CT CARDIAC CORONARY ARTERY CALCIUM SCORE
3 series · 14 of 20 positions shown, 16 images · non-contrast
Comparison: No priors.

CLINICAL DATA: 39-year-old Asian female. Evaluate for coronary
artery disease.

EXAM:
CT CARDIAC CORONARY ARTERY CALCIUM SCORE
TECHNIQUE: Non-contrast imaging through the heart was performed using
prospective ECG gating. Image post processing was performed on an
independent workstation, allowing for quantitative analysis of the
heart and coronary arteries. Note that this exam targets the heart
and the chest was not imaged in its entirety.

[Series 2: calcium scoring 2.00 qr36 bestdiast 71% hrt calciu · axial · 0.35mm/px · z∈[+1781,+1859]mm · 4 of 65 slices shown]
[im 13/65  vessel]
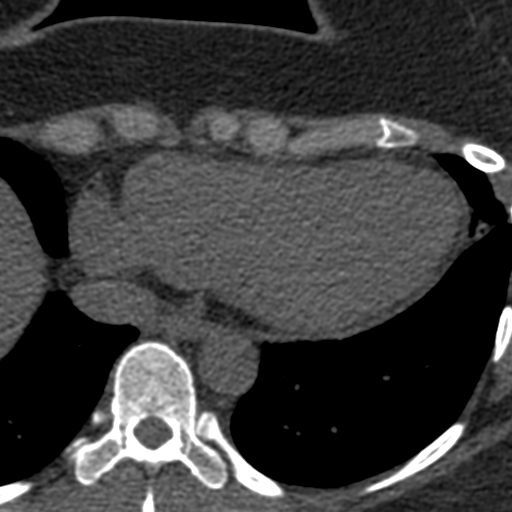
[im 26/65  vessel]
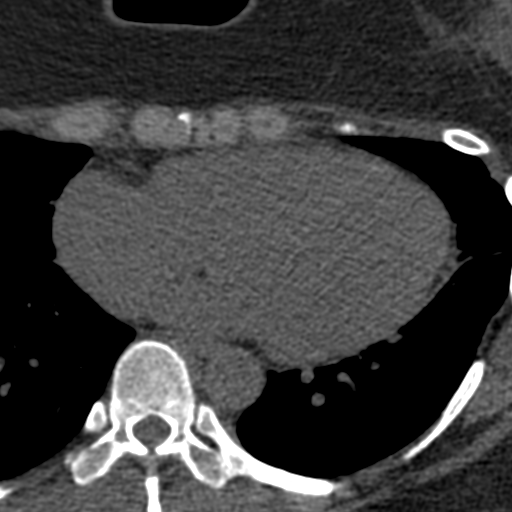
[im 39/65  vessel]
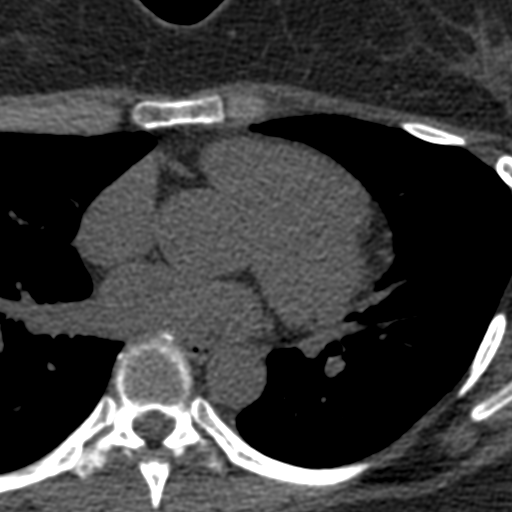
[im 52/65  vessel]
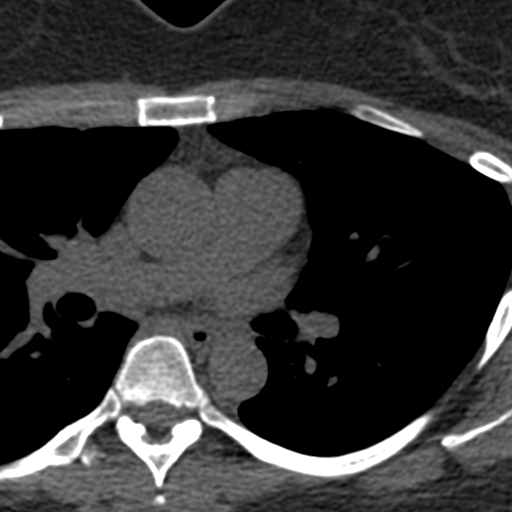

[Series 3: calcium scoring 2.00 br40 bestdiast 71% axial · axial · 0.57mm/px · z∈[+1777,+1863]mm · 5 of 65 slices shown, 7 images]
[im 11/65  vessel]
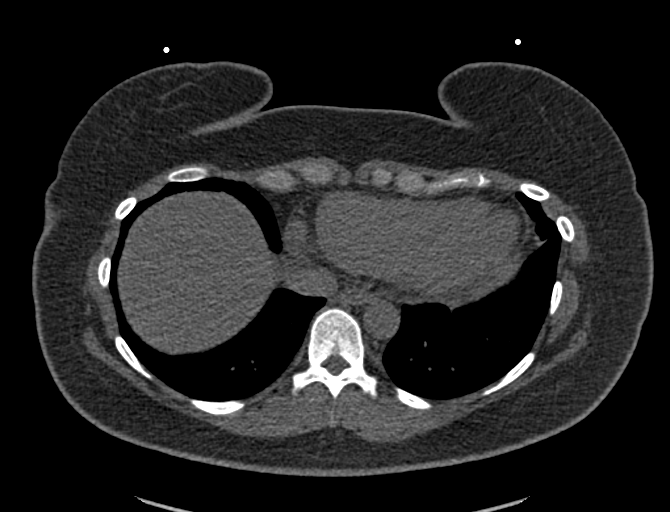
[im 11/65  lung]
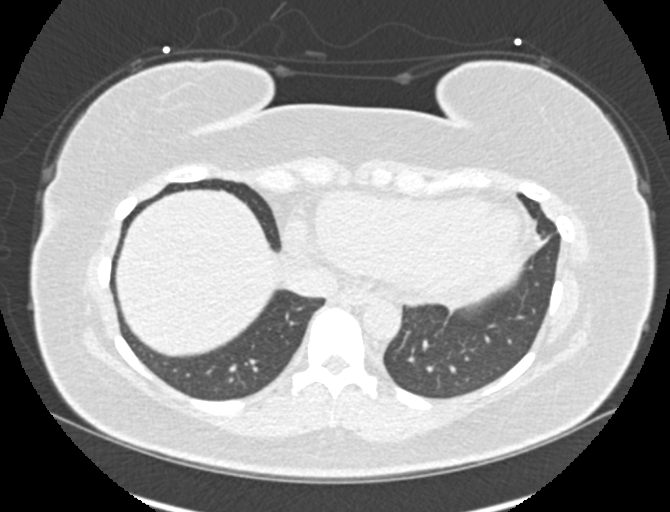
[im 22/65  vessel]
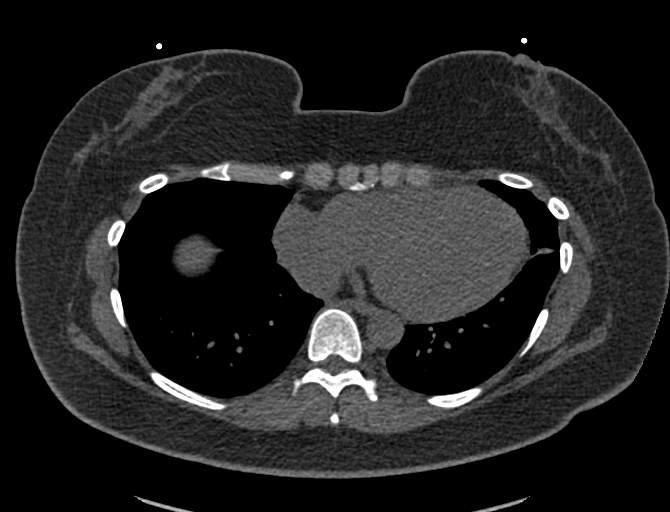
[im 33/65  vessel]
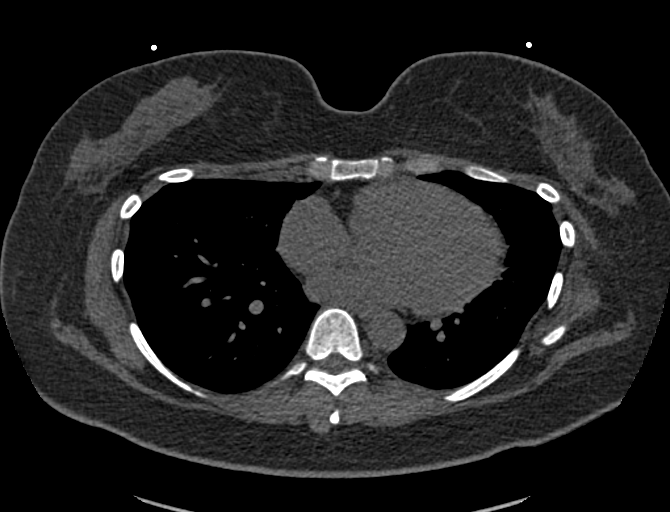
[im 43/65  vessel]
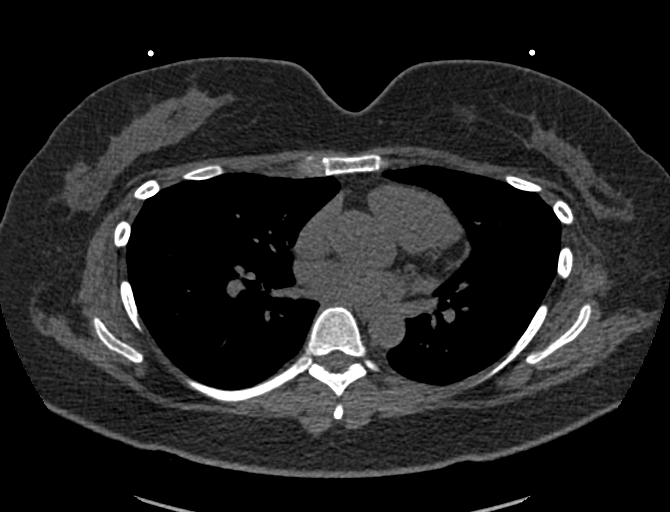
[im 54/65  vessel]
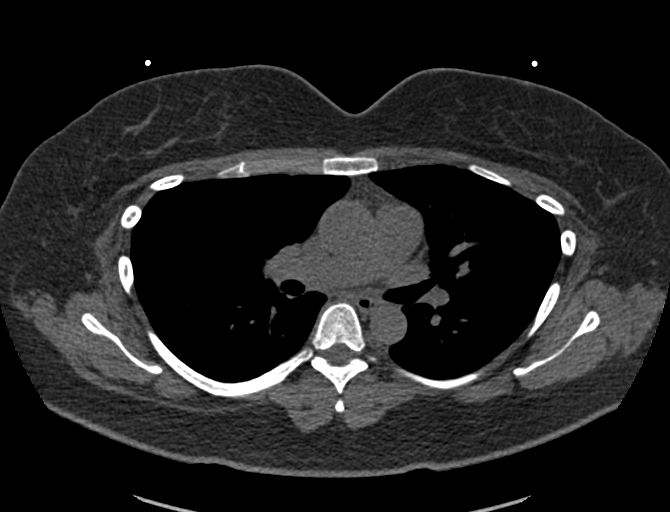
[im 54/65  lung]
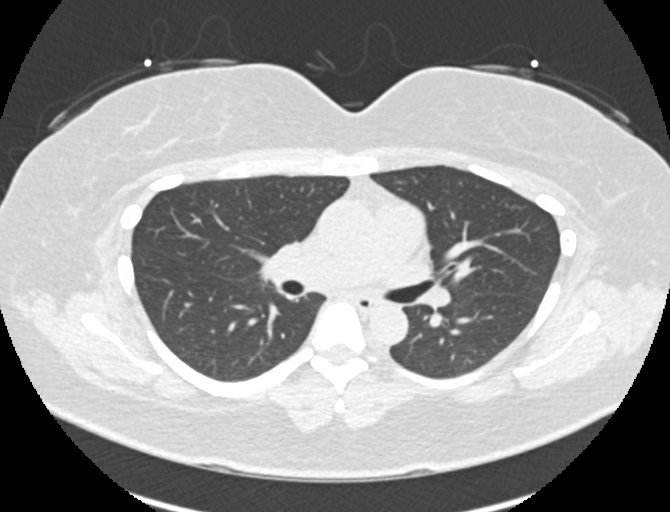

[Series 9: calcium scoring 2.00 br60 bestdiast 71% lungs · axial · 0.57mm/px · z∈[+1777,+1863]mm · 5 of 65 slices shown]
[im 11/65  vessel]
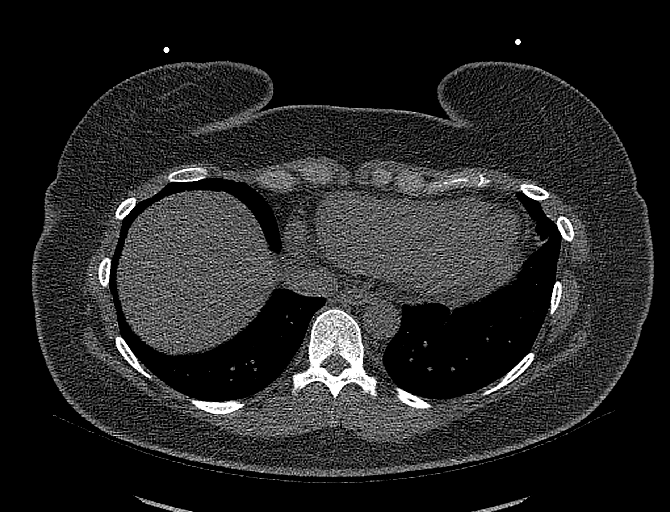
[im 22/65  vessel]
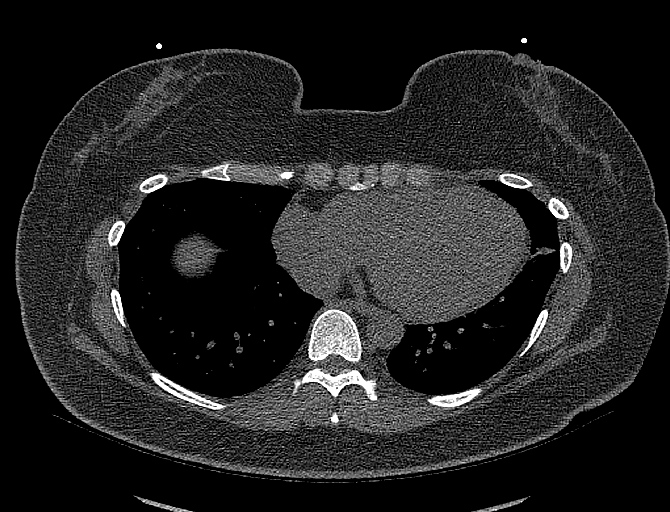
[im 33/65  vessel]
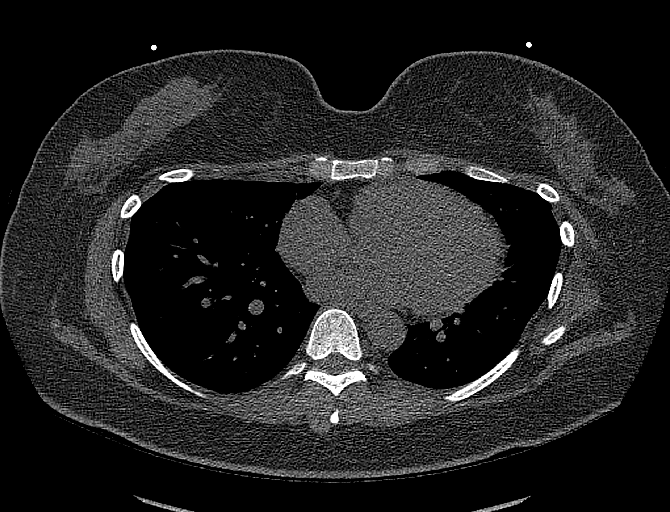
[im 43/65  vessel]
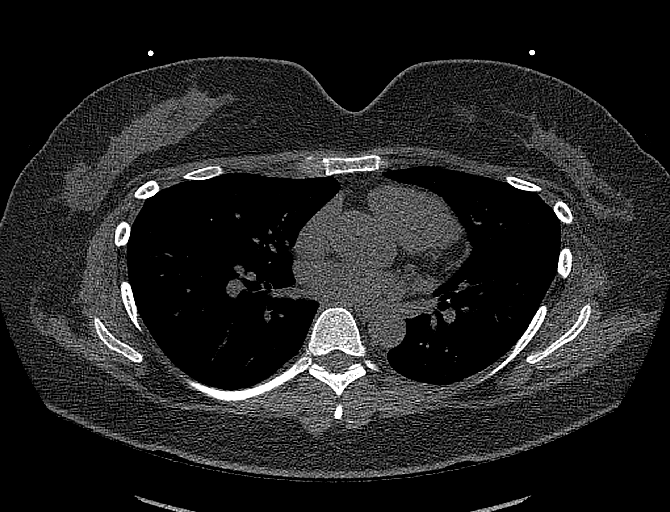
[im 54/65  vessel]
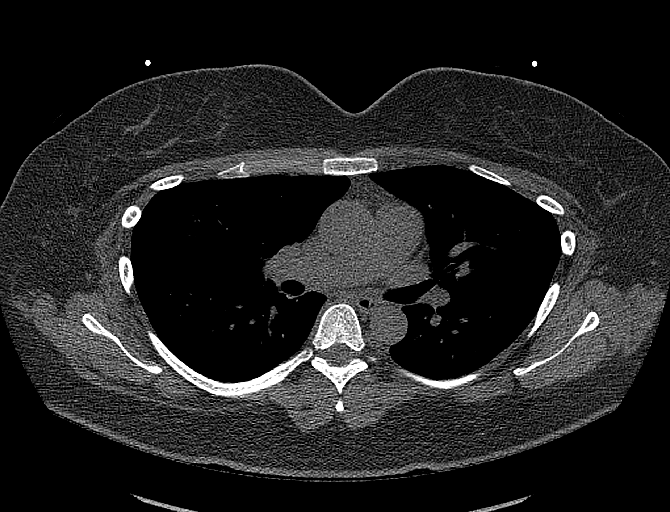

[14 of 20 positions shown; findings below may reference images not displayed]

FINDINGS: CORONARY CALCIUM SCORES:

Left Main: 0

LAD: 0

LCx: 0

RCA: 0

Total Agatston Score: 0

[HOSPITAL] percentile: N/A

AORTA MEASUREMENTS:

Ascending Aorta: 31 mm

Descending Aorta: 22 mm

EXTRACARDIAC FINDINGS:

Within the visualized portions of the thorax there are no suspicious
appearing pulmonary nodules or masses, there is no acute
consolidative airspace disease, no pleural effusions, no
pneumothorax and no lymphadenopathy. Visualized portions of the
upper abdomen are unremarkable. There are no aggressive appearing
lytic or blastic lesions noted in the visualized portions of the
skeleton.
IMPRESSION: 1. Patient's total coronary artery calcium score is 0 which
indicates a very low (but nonzero) risk of major adverse
cardiovascular events over the next 10 years.
2. No significant incidental noncardiac findings are noted.

## 2023-07-02 ENCOUNTER — Ambulatory Visit
Admission: RE | Admit: 2023-07-02 | Discharge: 2023-07-02 | Disposition: A | Discharge status: Home / Self Care | Payer: BC Managed Care – PPO | Source: Ambulatory Visit | Attending: Obstetrics and Gynecology | Admitting: Obstetrics and Gynecology

## 2023-07-02 DIAGNOSIS — R928 Other abnormal and inconclusive findings on diagnostic imaging of breast: Secondary | ICD-10-CM | POA: Diagnosis not present

## 2023-07-02 DIAGNOSIS — N6489 Other specified disorders of breast: Secondary | ICD-10-CM

## 2023-07-11 DIAGNOSIS — E785 Hyperlipidemia, unspecified: Secondary | ICD-10-CM | POA: Diagnosis not present

## 2023-08-08 DIAGNOSIS — K219 Gastro-esophageal reflux disease without esophagitis: Secondary | ICD-10-CM | POA: Diagnosis not present

## 2023-08-08 DIAGNOSIS — Z0000 Encounter for general adult medical examination without abnormal findings: Secondary | ICD-10-CM | POA: Diagnosis not present

## 2023-08-08 DIAGNOSIS — R82998 Other abnormal findings in urine: Secondary | ICD-10-CM | POA: Diagnosis not present

## 2023-11-06 IMAGING — US US BREAST*R* LIMITED INC AXILLA
1 series · 3 of 3 positions shown · non-contrast
Comparison: Previous exam(s).

CLINICAL DATA: Screening recall for a right breast asymmetry.

EXAM:
DIGITAL DIAGNOSTIC UNILATERAL RIGHT MAMMOGRAM WITH TOMOSYNTHESIS AND
CAD; ULTRASOUND RIGHT BREAST LIMITED
TECHNIQUE: Right digital diagnostic mammography and breast tomosynthesis was
performed. The images were evaluated with computer-aided detection.;
Targeted ultrasound examination of the right breast was performed

[Series 1: us breast*right* limited inc axilla · 0.07mm/px · 3 of 3 slices shown]
[im 1/3]
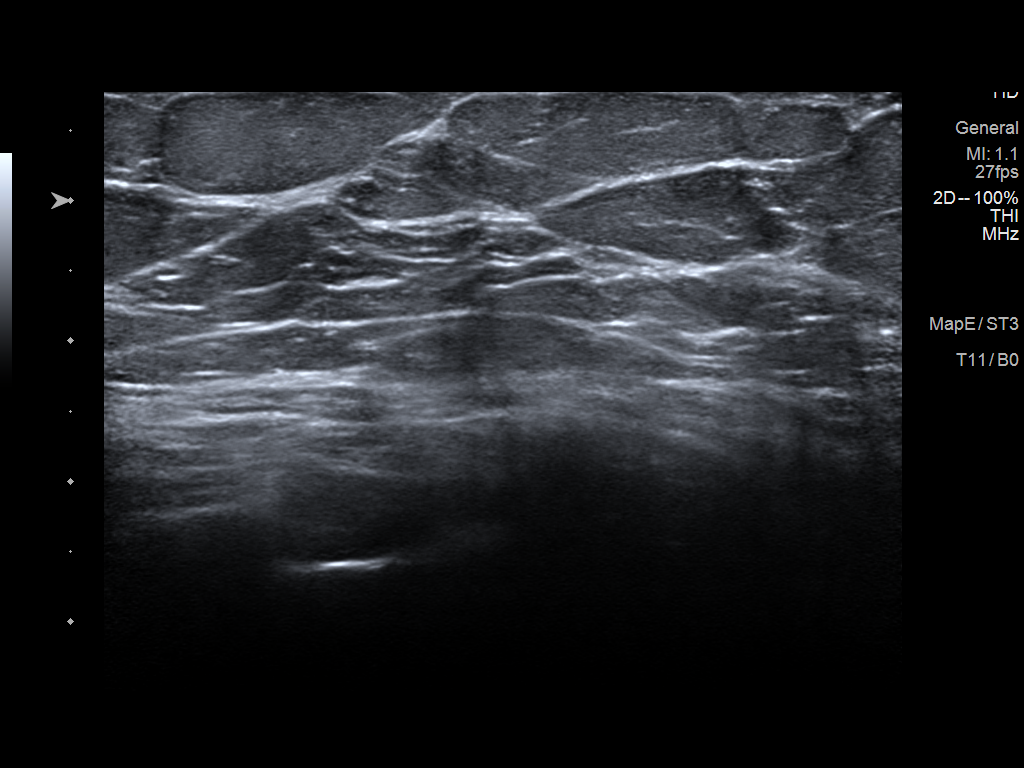
[im 2/3]
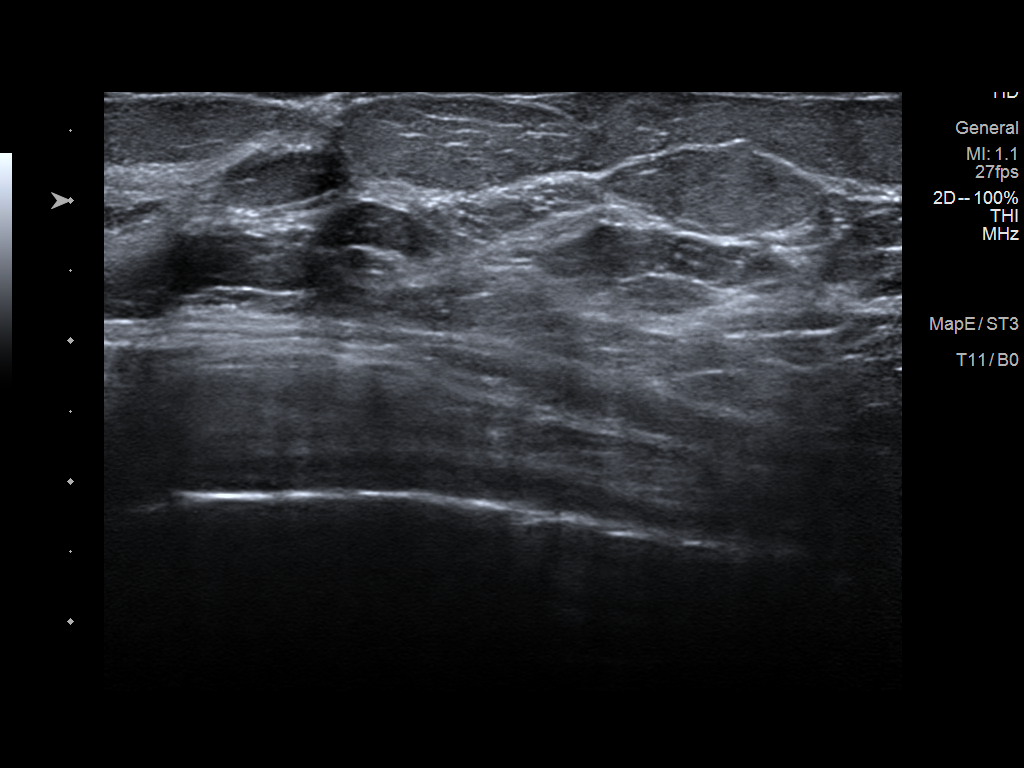
[im 3/3]
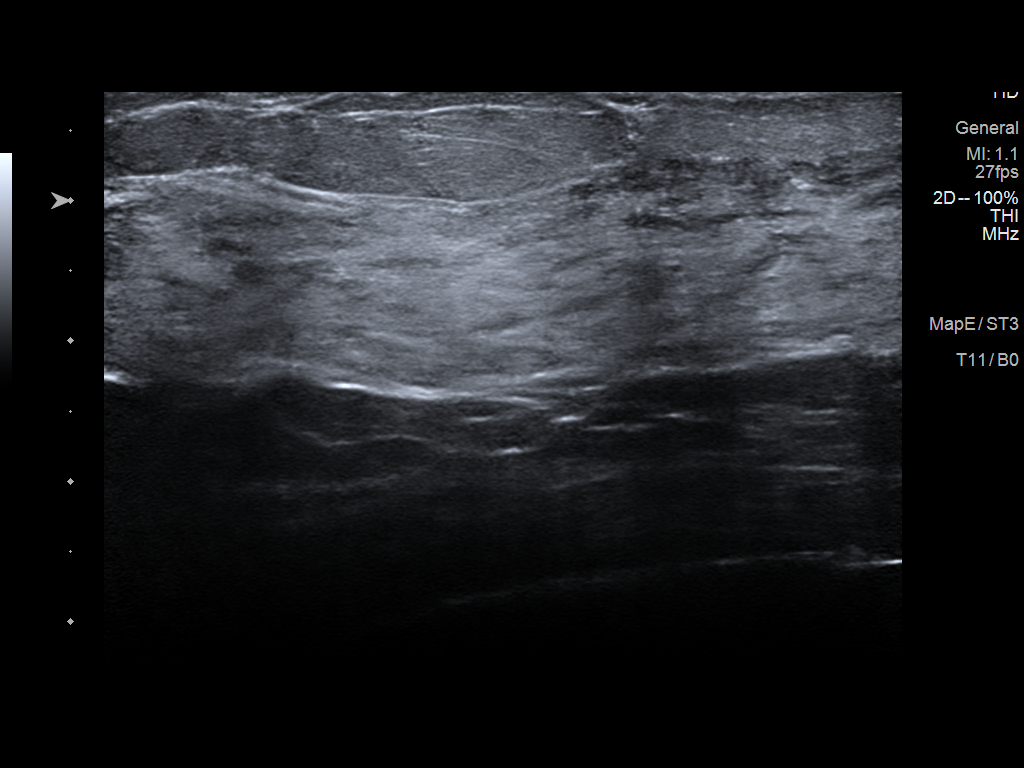

[3 of 3 positions shown; findings below may reference images not displayed]

ACR Breast Density Category c: The breast tissue is heterogeneously
dense, which may obscure small masses.
FINDINGS: No persistent suspicious abnormalities are seen in the lateral
posterior right breast on the spot compression tomosynthesis images.
A repeat full paddle CC projection was performed demonstrating a 1
cm asymmetry in the lower outer posterior right breast.

Ultrasound targeted to the lower outer right breast demonstrates
normal fibroglandular tissue. No suspicious masses or areas of
shadowing identified.
IMPRESSION: There is a benign appearing asymmetry in the lower outer right
breast without a sonographic correlate. This most likely represents
an island of fibroglandular tissue.

RECOMMENDATION:
Six-month follow-up diagnostic right breast mammogram.

I have discussed the findings and recommendations with the patient.
If applicable, a reminder letter will be sent to the patient
regarding the next appointment.

BI-RADS CATEGORY  3: Probably benign.
